# Patient Record
Sex: Male | Born: 1955 | Race: White | Hispanic: No | Smoking: Never smoker
Health system: Southern US, Community
[De-identification: ages and names within clinical notes are randomized; demographics above are authoritative.]

## PROBLEM LIST (undated history)

## (undated) DIAGNOSIS — H269 Unspecified cataract: Secondary | ICD-10-CM

## (undated) DIAGNOSIS — I10 Essential (primary) hypertension: Secondary | ICD-10-CM

## (undated) DIAGNOSIS — C801 Malignant (primary) neoplasm, unspecified: Secondary | ICD-10-CM

## (undated) DIAGNOSIS — E7800 Pure hypercholesterolemia, unspecified: Secondary | ICD-10-CM

## (undated) DIAGNOSIS — K219 Gastro-esophageal reflux disease without esophagitis: Secondary | ICD-10-CM

## (undated) HISTORY — DX: Unspecified cataract: H26.9

## (undated) HISTORY — DX: Pure hypercholesterolemia, unspecified: E78.00

---

## 2006-05-04 HISTORY — PX: HERNIA REPAIR: SHX51

## 2008-05-04 HISTORY — PX: AMPUTATION FINGER: SHX6594

## 2010-05-04 HISTORY — PX: COLONOSCOPY: SHX174

## 2017-05-05 DIAGNOSIS — R9431 Abnormal electrocardiogram [ECG] [EKG]: Secondary | ICD-10-CM

## 2020-01-09 ENCOUNTER — Other Ambulatory Visit: Payer: Self-pay | Admitting: Urology

## 2020-01-09 ENCOUNTER — Other Ambulatory Visit (HOSPITAL_COMMUNITY): Payer: Self-pay | Admitting: Urology

## 2020-01-09 DIAGNOSIS — N2889 Other specified disorders of kidney and ureter: Secondary | ICD-10-CM

## 2020-01-29 ENCOUNTER — Encounter (HOSPITAL_COMMUNITY): Payer: Self-pay

## 2020-01-29 NOTE — Patient Instructions (Addendum)
DUE TO COVID-19 ONLY ONE VISITOR IS ALLOWED TO COME WITH YOU AND STAY IN THE WAITING ROOM ONLY DURING PRE OP AND PROCEDURE DAY OF SURGERY. THE 1 VISITOR  MAY VISIT WITH YOU AFTER SURGERY IN YOUR PRIVATE ROOM DURING VISITING HOURS ONLY!  YOU NEED TO HAVE A COVID 19 TEST ON__10/1_____ @_10 :15______, THIS TEST MUST BE DONE BEFORE SURGERY,  COVID TESTING SITE [facility], IT IS ON THE RIGHT GOING OUT [address] APPROXIMATELY  2 MINUTES PAST [employer] ON THE RIGHT. ONCE YOUR COVID TEST IS COMPLETED,  PLEASE BEGIN THE QUARANTINE INSTRUCTIONS AS OUTLINED IN YOUR HANDOUT.                [facility]    Your procedure is scheduled on: 02/06/20   Report to [facility] Main  Entrance   Report to admitting at  10:30 AM     Call this number if you have problems the morning of surgery [name], NO CHEWING GUM [facility].     Take these medicines the morning of surgery with A SIP OF WATER: Pantoprazole, Flonase if needed                                 You may not have any metal on your body including              piercings  Do not wear jewelry, lotions, powders or deodorant                        Men may shave face and neck.   Do not bring valuables to the hospital. [facility].  Contacts, dentures or bridgework may not be worn into surgery.       Patients discharged the day of surgery will not be allowed to drive home.  IF YOU ARE HAVING SURGERY AND GOING HOME THE SAME DAY, YOU MUST HAVE AN ADULT TO DRIVE YOU HOME AND BE WITH YOU FOR 24 HOURS.  YOU MAY GO HOME BY TAXI OR UBER OR ORTHERWISE, BUT AN ADULT MUST ACCOMPANY YOU HOME AND STAY WITH YOU FOR 24 HOURS.  Name and phone number of your driver:  Special Instructions: N/A              Please read over the following fact sheets you were  given: _____________________________________________________________________             [facility] - Preparing for Surgery  Before surgery, you can play an important role.   Because skin is not sterile, your skin needs to be as free of germs as possible.   You can reduce the number of germs on your skin by washing with CHG (chlorahexidine gluconate) soap before surgery .  CHG is an antiseptic cleaner which kills germs and bonds with the skin to continue killing germs even after washing. Please DO NOT use if you have an allergy to CHG or antibacterial soaps.   If your skin becomes reddened/irritated stop using the CHG and inform your nurse when you arrive at Short Stay. .  You may shave your face/neck.  Please follow these instructions carefully:  1.  Shower with CHG Soap the night  before surgery and the  morning of Surgery.  2.  If you choose to wash your hair, wash your hair first as usual with your  normal  shampoo.  3.  After you shampoo, rinse your hair and body thoroughly to remove the  shampoo.                                        4.  Use CHG as you would any other liquid soap.  You can apply chg directly  to the skin and wash                       Gently with a scrungie or clean washcloth.  5.  Apply the CHG Soap to your body ONLY FROM THE NECK DOWN.   Do not use on face/ open                           Wound or open sores. Avoid contact with eyes, ears mouth and genitals (private parts).                       Wash face,  Genitals (private parts) with your normal soap.             6.  Wash thoroughly, paying special attention to the area where your surgery  will be performed.  7.  Thoroughly rinse your body with warm water from the neck down.  8.  DO NOT shower/wash with your normal soap after using and rinsing off  the CHG Soap.             9.  Pat yourself dry with a clean towel.            10.  Wear clean pajamas.            11.  Place clean sheets on your bed the night of  your first shower and do not  sleep with pets. Day of Surgery : Do not apply any lotions/deodorants the morning of surgery.  Please wear clean clothes to the hospital/surgery center.  FAILURE TO FOLLOW THESE INSTRUCTIONS MAY RESULT IN THE CANCELLATION OF YOUR SURGERY PATIENT SIGNATURE_________________________________  NURSE SIGNATURE__________________________________  ________________________________________________________________________  WHAT IS A BLOOD TRANSFUSION? Blood Transfusion Information  A transfusion is the replacement of blood or some of its parts. Blood is made up of multiple cells which provide different functions.  Red blood cells carry oxygen and are used for blood loss replacement.  White blood cells fight against infection.  Platelets control bleeding.  Plasma helps clot blood.  Other blood products are available for specialized needs, such as hemophilia or other clotting disorders. BEFORE THE TRANSFUSION  Who gives blood for transfusions?   Healthy volunteers who are fully evaluated to make sure their blood is safe. This is blood bank blood. Transfusion therapy is the safest it has ever been in the practice of medicine. Before blood is taken from a donor, a complete history is taken to make sure that person has no history of diseases nor engages in risky social behavior (examples are intravenous drug use or sexual activity with multiple partners). The donor's travel history is screened to minimize risk of transmitting infections, such as malaria. The donated blood is tested for signs of infectious diseases, such as HIV and hepatitis. The blood  is then tested to be sure it is compatible with you in order to minimize the chance of a transfusion reaction. If you or a relative donates blood, this is often done in anticipation of surgery and is not appropriate for emergency situations. It takes many days to process the donated blood. RISKS AND COMPLICATIONS Although  transfusion therapy is very safe and saves many lives, the main dangers of transfusion include:   Getting an infectious disease.  Developing a transfusion reaction. This is an allergic reaction to something in the blood you were given. Every precaution is taken to prevent this. The decision to have a blood transfusion has been considered carefully by your caregiver before blood is given. Blood is not given unless the benefits outweigh the risks. AFTER THE TRANSFUSION  Right after receiving a blood transfusion, you will usually feel much better and more energetic. This is especially true if your red blood cells have gotten low (anemic). The transfusion raises the level of the red blood cells which carry oxygen, and this usually causes an energy increase.  The nurse administering the transfusion will monitor you carefully for complications. HOME CARE INSTRUCTIONS  No special instructions are needed after a transfusion. You may find your energy is better. Speak with your caregiver about any limitations on activity for underlying diseases you may have. SEEK MEDICAL CARE IF:   Your condition is not improving after your transfusion.  You develop redness or irritation at the intravenous (IV) site. SEEK IMMEDIATE MEDICAL CARE IF:  Any of the following symptoms occur over the next 12 hours:  Shaking chills.  You have a temperature by mouth above 102 F (38.9 C), not controlled by medicine.  Chest, back, or muscle pain.  People around you feel you are not acting correctly or are confused.  Shortness of breath or difficulty breathing.  Dizziness and fainting.  You get a rash or develop hives.  You have a decrease in urine output.  Your urine turns a dark color or changes to pink, red, or brown. Any of the following symptoms occur over the next 10 days:  You have a temperature by mouth above 102 F (38.9 C), not controlled by medicine.  Shortness of breath.  Weakness after normal  activity.  The white part of the eye turns yellow (jaundice).  You have a decrease in the amount of urine or are urinating less often.  Your urine turns a dark color or changes to pink, red, or brown. Document Released: 04/17/2000 Document Revised: 07/13/2011 Document Reviewed: 12/05/2007 [facility] Patient Information 2014 [place], [place].  _______________________________________________________________________

## 2020-01-30 ENCOUNTER — Encounter (HOSPITAL_COMMUNITY): Payer: Self-pay

## 2020-01-30 ENCOUNTER — Encounter (HOSPITAL_COMMUNITY)
Admission: RE | Admit: 2020-01-30 | Discharge: 2020-01-30 | Disposition: A | Discharge status: Home / Self Care | Payer: PRIVATE HEALTH INSURANCE | Source: Ambulatory Visit | Attending: Urology | Admitting: Urology

## 2020-01-30 ENCOUNTER — Other Ambulatory Visit: Payer: Self-pay

## 2020-01-30 DIAGNOSIS — I10 Essential (primary) hypertension: Secondary | ICD-10-CM | POA: Diagnosis not present

## 2020-01-30 DIAGNOSIS — Z01818 Encounter for other preprocedural examination: Secondary | ICD-10-CM | POA: Diagnosis present

## 2020-01-30 DIAGNOSIS — Z20822 Contact with and (suspected) exposure to covid-19: Secondary | ICD-10-CM | POA: Insufficient documentation

## 2020-01-30 HISTORY — DX: Essential (primary) hypertension: I10

## 2020-01-30 HISTORY — DX: Malignant (primary) neoplasm, unspecified: C80.1

## 2020-01-30 HISTORY — DX: Gastro-esophageal reflux disease without esophagitis: K21.9

## 2020-01-30 LAB — CBC
HCT: 40.2 % (ref 39.0–52.0)
Hemoglobin: 13.5 g/dL (ref 13.0–17.0)
MCH: 31.8 pg (ref 26.0–34.0)
MCHC: 33.6 g/dL (ref 30.0–36.0)
MCV: 94.8 fL (ref 80.0–100.0)
Platelets: 228 10*3/uL (ref 150–400)
RBC: 4.24 MIL/uL (ref 4.22–5.81)
RDW: 12.6 % (ref 11.5–15.5)
WBC: 9 10*3/uL (ref 4.0–10.5)
nRBC: 0 % (ref 0.0–0.2)

## 2020-01-30 LAB — BASIC METABOLIC PANEL
Anion gap: 7 (ref 5–15)
BUN: 13 mg/dL (ref 8–23)
CO2: 25 mmol/L (ref 22–32)
Calcium: 9.1 mg/dL (ref 8.9–10.3)
Chloride: 106 mmol/L (ref 98–111)
Creatinine, Ser: 1.07 mg/dL (ref 0.61–1.24)
GFR calc Af Amer: >60 mL/min (ref >60)
GFR calc non Af Amer: >60 mL/min (ref >60)
Glucose, Bld: 89 mg/dL (ref 70–99)
Potassium: 4.6 mmol/L (ref 3.5–5.1)
Sodium: 138 mmol/L (ref 135–145)

## 2020-01-30 NOTE — Progress Notes (Signed)
COVID Vaccine Completed:Yes Date COVID Vaccine completed:06/21/19 COVID vaccine manufacturer:  Moderna     PCP - Carlyle Lipa Cardiologist - no  Chest x-ray - no EKG - Requested from [facility] family practice in [place] Stress Test - no ECHO - no Cardiac Cath - no Pacemaker/ICD device last checked:NA  Sleep Study - no CPAP -   Fasting Blood Sugar - NA Checks Blood Sugar _____ times a day  Blood Thinner Instructions:ASA/ Dr. Nicki Reaper Aspirin Instructions:Stop 5 days before DOS/ Dr. Abner Greenspan Last Dose:02/01/20  Anesthesia review:   Patient denies shortness of breath, fever, cough and chest pain at PAT appointment yes   Patient verbalized understanding of instructions that were given to them at the PAT appointment. Patient was also instructed that they will need to review over the PAT instructions again at home before surgery. Yes Pt works out every day he has no SOB climbing stairs, working or with ADLs

## 2020-02-02 ENCOUNTER — Other Ambulatory Visit (HOSPITAL_COMMUNITY)
Admission: RE | Admit: 2020-02-02 | Discharge: 2020-02-02 | Disposition: A | Discharge status: Home / Self Care | Payer: PRIVATE HEALTH INSURANCE | Source: Ambulatory Visit | Attending: Urology | Admitting: Urology

## 2020-02-02 DIAGNOSIS — Z20822 Contact with and (suspected) exposure to covid-19: Secondary | ICD-10-CM | POA: Diagnosis present

## 2020-02-02 DIAGNOSIS — Z01812 Encounter for preprocedural laboratory examination: Secondary | ICD-10-CM | POA: Insufficient documentation

## 2020-02-02 LAB — SARS CORONAVIRUS 2 (TAT 6-24 HRS): SARS Coronavirus 2: NEGATIVE

## 2020-02-06 ENCOUNTER — Ambulatory Visit (HOSPITAL_COMMUNITY): Payer: PRIVATE HEALTH INSURANCE | Admitting: Anesthesiology

## 2020-02-06 ENCOUNTER — Other Ambulatory Visit: Payer: Self-pay

## 2020-02-06 ENCOUNTER — Ambulatory Visit (HOSPITAL_COMMUNITY)
Admission: RE | Admit: 2020-02-06 | Discharge: 2020-02-06 | Disposition: A | Discharge status: Home / Self Care | Payer: PRIVATE HEALTH INSURANCE | Source: Ambulatory Visit | Attending: Urology | Admitting: Urology

## 2020-02-06 ENCOUNTER — Inpatient Hospital Stay (HOSPITAL_COMMUNITY)
Admission: AD | Admit: 2020-02-06 | Discharge: 2020-02-10 | DRG: 658 | Disposition: A | Discharge status: Home / Self Care | Payer: PRIVATE HEALTH INSURANCE | Attending: Urology | Admitting: Urology

## 2020-02-06 ENCOUNTER — Encounter (HOSPITAL_COMMUNITY): Payer: Self-pay | Admitting: Urology

## 2020-02-06 ENCOUNTER — Encounter (HOSPITAL_COMMUNITY)
Admission: AD | Disposition: A | Discharge status: Home / Self Care | Payer: Self-pay | Source: Home / Self Care | Attending: Urology

## 2020-02-06 DIAGNOSIS — Z20822 Contact with and (suspected) exposure to covid-19: Secondary | ICD-10-CM | POA: Diagnosis present

## 2020-02-06 DIAGNOSIS — N2889 Other specified disorders of kidney and ureter: Secondary | ICD-10-CM | POA: Diagnosis present

## 2020-02-06 DIAGNOSIS — C641 Malignant neoplasm of right kidney, except renal pelvis: Secondary | ICD-10-CM | POA: Diagnosis present

## 2020-02-06 DIAGNOSIS — Z7982 Long term (current) use of aspirin: Secondary | ICD-10-CM

## 2020-02-06 DIAGNOSIS — I10 Essential (primary) hypertension: Secondary | ICD-10-CM | POA: Diagnosis present

## 2020-02-06 DIAGNOSIS — K219 Gastro-esophageal reflux disease without esophagitis: Secondary | ICD-10-CM | POA: Diagnosis present

## 2020-02-06 DIAGNOSIS — Z79899 Other long term (current) drug therapy: Secondary | ICD-10-CM

## 2020-02-06 HISTORY — PX: OPERATIVE ULTRASOUND: SHX5996

## 2020-02-06 HISTORY — PX: ROBOTIC ASSITED PARTIAL NEPHRECTOMY: SHX6087

## 2020-02-06 LAB — CBC
HCT: 28.8 % — ABNORMAL LOW (ref 39.0–52.0)
Hemoglobin: 9.5 g/dL — ABNORMAL LOW (ref 13.0–17.0)
MCH: 32 pg (ref 26.0–34.0)
MCHC: 33 g/dL (ref 30.0–36.0)
MCV: 97 fL (ref 80.0–100.0)
Platelets: 223 10*3/uL (ref 150–400)
RBC: 2.97 MIL/uL — ABNORMAL LOW (ref 4.22–5.81)
RDW: 12.9 % (ref 11.5–15.5)
WBC: 24.6 10*3/uL — ABNORMAL HIGH (ref 4.0–10.5)
nRBC: 0 % (ref 0.0–0.2)

## 2020-02-06 LAB — CREATININE, SERUM
Creatinine, Ser: 1.41 mg/dL — ABNORMAL HIGH (ref 0.61–1.24)
GFR calc non Af Amer: 52 mL/min — ABNORMAL LOW (ref >60)

## 2020-02-06 LAB — TYPE AND SCREEN
ABO/RH(D): A POS
Antibody Screen: NEGATIVE

## 2020-02-06 LAB — HIV ANTIBODY (ROUTINE TESTING W REFLEX): HIV Screen 4th Generation wRfx: NONREACTIVE

## 2020-02-06 LAB — ABO/RH: ABO/RH(D): A POS

## 2020-02-06 SURGERY — NEPHRECTOMY, PARTIAL, ROBOT-ASSISTED
Anesthesia: General | Site: Abdomen | Laterality: Right

## 2020-02-06 MED ORDER — PROPOFOL 10 MG/ML IV BOLUS
INTRAVENOUS | Status: AC
  Filled 2020-02-06: qty 20

## 2020-02-06 MED ORDER — LIDOCAINE 2% (20 MG/ML) 5 ML SYRINGE
INTRAMUSCULAR | Status: AC
  Filled 2020-02-06: qty 5

## 2020-02-06 MED ORDER — EPHEDRINE 5 MG/ML INJ
INTRAVENOUS | Status: AC
  Filled 2020-02-06: qty 10

## 2020-02-06 MED ORDER — OXYCODONE-ACETAMINOPHEN 5-325 MG PO TABS
1.0000 | ORAL_TABLET | ORAL | Status: DC | PRN
  Administered 2020-02-07 (×2): 1 via ORAL
  Administered 2020-02-07 – 2020-02-09 (×8): 2 via ORAL
  Administered 2020-02-09 – 2020-02-10 (×4): 1 via ORAL
  Filled 2020-02-06 (×5): qty 2
  Filled 2020-02-06: qty 1
  Filled 2020-02-06: qty 2
  Filled 2020-02-06 (×2): qty 1
  Filled 2020-02-06 (×4): qty 2
  Filled 2020-02-06: qty 1

## 2020-02-06 MED ORDER — BACITRACIN-NEOMYCIN-POLYMYXIN 400-5-5000 EX OINT
1.0000 "application " | TOPICAL_OINTMENT | Freq: Three times a day (TID) | CUTANEOUS | Status: DC | PRN
  Filled 2020-02-06: qty 1

## 2020-02-06 MED ORDER — BELLADONNA ALKALOIDS-OPIUM 16.2-60 MG RE SUPP: 1.0000 | Freq: Four times a day (QID) | RECTAL | Status: DC | PRN

## 2020-02-06 MED ORDER — MIDAZOLAM HCL 2 MG/2ML IJ SOLN
INTRAMUSCULAR | Status: AC
  Filled 2020-02-06: qty 2

## 2020-02-06 MED ORDER — STERILE WATER FOR IRRIGATION IR SOLN
Status: DC | PRN
  Administered 2020-02-06: 1000 mL

## 2020-02-06 MED ORDER — PHENYLEPHRINE HCL (PRESSORS) 10 MG/ML IV SOLN
INTRAVENOUS | Status: AC
  Filled 2020-02-06: qty 1

## 2020-02-06 MED ORDER — OXYBUTYNIN CHLORIDE 5 MG PO TABS: 5.0000 mg | ORAL_TABLET | Freq: Three times a day (TID) | ORAL | Status: DC | PRN

## 2020-02-06 MED ORDER — PANTOPRAZOLE SODIUM 40 MG PO TBEC
40.0000 mg | DELAYED_RELEASE_TABLET | Freq: Every day | ORAL | Status: DC
  Administered 2020-02-07 – 2020-02-10 (×4): 40 mg via ORAL
  Filled 2020-02-06 (×5): qty 1

## 2020-02-06 MED ORDER — FENTANYL CITRATE (PF) 100 MCG/2ML IJ SOLN
INTRAMUSCULAR | Status: AC
  Filled 2020-02-06: qty 2

## 2020-02-06 MED ORDER — HEPARIN SODIUM (PORCINE) 5000 UNIT/ML IJ SOLN
5000.0000 [IU] | Freq: Three times a day (TID) | INTRAMUSCULAR | Status: DC
  Administered 2020-02-07 – 2020-02-10 (×9): 5000 [IU] via SUBCUTANEOUS
  Filled 2020-02-06 (×9): qty 1

## 2020-02-06 MED ORDER — ACETAMINOPHEN 325 MG PO TABS: 650.0000 mg | ORAL_TABLET | ORAL | Status: DC | PRN

## 2020-02-06 MED ORDER — POLYETHYLENE GLYCOL 3350 17 G PO PACK
17.0000 g | PACK | Freq: Every day | ORAL | Status: DC | PRN
  Administered 2020-02-09: 17 g via ORAL
  Filled 2020-02-06 (×2): qty 1

## 2020-02-06 MED ORDER — LIDOCAINE 2% (20 MG/ML) 5 ML SYRINGE
INTRAMUSCULAR | Status: DC | PRN
  Administered 2020-02-06: 60 mg via INTRAVENOUS

## 2020-02-06 MED ORDER — IRBESARTAN 150 MG PO TABS
150.0000 mg | ORAL_TABLET | Freq: Every day | ORAL | Status: DC
  Administered 2020-02-06 – 2020-02-10 (×5): 150 mg via ORAL
  Filled 2020-02-06 (×5): qty 1

## 2020-02-06 MED ORDER — PHENYLEPHRINE HCL-NACL 10-0.9 MG/250ML-% IV SOLN
INTRAVENOUS | Status: DC | PRN
  Administered 2020-02-06: 25 ug/min via INTRAVENOUS

## 2020-02-06 MED ORDER — CEFAZOLIN SODIUM-DEXTROSE 2-4 GM/100ML-% IV SOLN
INTRAVENOUS | Status: AC
  Filled 2020-02-06: qty 100

## 2020-02-06 MED ORDER — OXYCODONE HCL 5 MG PO TABS
ORAL_TABLET | ORAL | Status: AC
  Filled 2020-02-06: qty 1

## 2020-02-06 MED ORDER — ALBUMIN HUMAN 5 % IV SOLN
INTRAVENOUS | Status: AC
  Filled 2020-02-06: qty 250

## 2020-02-06 MED ORDER — BUPIVACAINE LIPOSOME 1.3 % IJ SUSP
20.0000 mL | Freq: Once | INTRAMUSCULAR | Status: AC
  Administered 2020-02-06: 40 mL
  Filled 2020-02-06: qty 20

## 2020-02-06 MED ORDER — ACETAMINOPHEN 10 MG/ML IV SOLN: 1000.0000 mg | Freq: Once | INTRAVENOUS | Status: DC | PRN

## 2020-02-06 MED ORDER — BISACODYL 10 MG RE SUPP
10.0000 mg | Freq: Every day | RECTAL | Status: DC | PRN
  Administered 2020-02-09: 10 mg via RECTAL
  Filled 2020-02-06 (×2): qty 1

## 2020-02-06 MED ORDER — DOCUSATE SODIUM 100 MG PO CAPS: 100.0000 mg | ORAL_CAPSULE | Freq: Every day | ORAL | 0 refills | Status: DC | PRN

## 2020-02-06 MED ORDER — FAMOTIDINE 20 MG PO TABS
40.0000 mg | ORAL_TABLET | Freq: Two times a day (BID) | ORAL | Status: DC
  Administered 2020-02-06 – 2020-02-07 (×2): 40 mg via ORAL
  Filled 2020-02-06 (×2): qty 2

## 2020-02-06 MED ORDER — HYDROMORPHONE HCL 1 MG/ML IJ SOLN
0.2500 mg | INTRAMUSCULAR | Status: DC | PRN
  Administered 2020-02-06 (×2): 0.5 mg via INTRAVENOUS

## 2020-02-06 MED ORDER — OXYCODONE HCL 5 MG/5ML PO SOLN: 5.0000 mg | Freq: Once | ORAL | Status: DC | PRN

## 2020-02-06 MED ORDER — MORPHINE SULFATE (PF) 4 MG/ML IV SOLN: 2.0000 mg | INTRAVENOUS | Status: DC | PRN

## 2020-02-06 MED ORDER — ADULT MULTIVITAMIN W/MINERALS CH
1.0000 | ORAL_TABLET | Freq: Every day | ORAL | Status: DC
  Administered 2020-02-06 – 2020-02-10 (×5): 1 via ORAL
  Filled 2020-02-06 (×5): qty 1

## 2020-02-06 MED ORDER — OXYCODONE-ACETAMINOPHEN 5-325 MG PO TABS: 1.0000 | ORAL_TABLET | ORAL | 0 refills | Status: DC | PRN

## 2020-02-06 MED ORDER — FLUTICASONE PROPIONATE 50 MCG/ACT NA SUSP: 1.0000 | Freq: Every day | NASAL | Status: DC | PRN

## 2020-02-06 MED ORDER — FENTANYL CITRATE (PF) 250 MCG/5ML IJ SOLN
INTRAMUSCULAR | Status: DC | PRN
Start: 2020-02-06 — End: 2020-02-06
  Administered 2020-02-06 (×3): 50 ug via INTRAVENOUS
  Administered 2020-02-06: 100 ug via INTRAVENOUS
  Administered 2020-02-06 (×4): 50 ug via INTRAVENOUS
  Administered 2020-02-06: 100 ug via INTRAVENOUS

## 2020-02-06 MED ORDER — LACTATED RINGERS IV SOLN: INTRAVENOUS | Status: DC

## 2020-02-06 MED ORDER — ALBUMIN HUMAN 5 % IV SOLN: INTRAVENOUS | Status: DC | PRN

## 2020-02-06 MED ORDER — MIDAZOLAM HCL 5 MG/5ML IJ SOLN
INTRAMUSCULAR | Status: DC | PRN
  Administered 2020-02-06: 2 mg via INTRAVENOUS

## 2020-02-06 MED ORDER — HEPARIN SODIUM (PORCINE) 5000 UNIT/ML IJ SOLN
INTRAMUSCULAR | Status: AC
  Administered 2020-02-06: 5000 [IU] via SUBCUTANEOUS
  Filled 2020-02-06: qty 1

## 2020-02-06 MED ORDER — PROPOFOL 10 MG/ML IV BOLUS
INTRAVENOUS | Status: DC | PRN
  Administered 2020-02-06: 150 mg via INTRAVENOUS

## 2020-02-06 MED ORDER — "VISTASEAL 4 ML SINGLE DOSE KIT "
4.0000 mL | PACK | Freq: Once | CUTANEOUS | Status: DC
  Filled 2020-02-06: qty 4

## 2020-02-06 MED ORDER — DIPHENHYDRAMINE HCL 12.5 MG/5ML PO ELIX
12.5000 mg | ORAL_SOLUTION | Freq: Four times a day (QID) | ORAL | Status: DC | PRN
  Filled 2020-02-06: qty 5

## 2020-02-06 MED ORDER — DIPHENHYDRAMINE HCL 50 MG/ML IJ SOLN: 12.5000 mg | Freq: Four times a day (QID) | INTRAMUSCULAR | Status: DC | PRN

## 2020-02-06 MED ORDER — DEXAMETHASONE SODIUM PHOSPHATE 10 MG/ML IJ SOLN
INTRAMUSCULAR | Status: AC
  Filled 2020-02-06: qty 1

## 2020-02-06 MED ORDER — ONDANSETRON HCL 4 MG/2ML IJ SOLN
INTRAMUSCULAR | Status: DC | PRN
  Administered 2020-02-06: 4 mg via INTRAVENOUS

## 2020-02-06 MED ORDER — ONDANSETRON HCL 4 MG/2ML IJ SOLN
INTRAMUSCULAR | Status: AC
  Filled 2020-02-06: qty 2

## 2020-02-06 MED ORDER — PHENYLEPHRINE 40 MCG/ML (10ML) SYRINGE FOR IV PUSH (FOR BLOOD PRESSURE SUPPORT)
PREFILLED_SYRINGE | INTRAVENOUS | Status: DC | PRN
  Administered 2020-02-06 (×3): 120 ug via INTRAVENOUS
  Administered 2020-02-06: 80 ug via INTRAVENOUS

## 2020-02-06 MED ORDER — LACTATED RINGERS IR SOLN
Status: DC | PRN
  Administered 2020-02-06: 1000 mL

## 2020-02-06 MED ORDER — SUGAMMADEX SODIUM 200 MG/2ML IV SOLN
INTRAVENOUS | Status: DC | PRN
  Administered 2020-02-06: 200 mg via INTRAVENOUS

## 2020-02-06 MED ORDER — SODIUM CHLORIDE (PF) 0.9 % IJ SOLN
INTRAMUSCULAR | Status: AC
  Filled 2020-02-06: qty 20

## 2020-02-06 MED ORDER — OXYCODONE-ACETAMINOPHEN 5-325 MG PO TABS
ORAL_TABLET | ORAL | Status: AC
  Administered 2020-02-06: 1 via ORAL
  Filled 2020-02-06: qty 1

## 2020-02-06 MED ORDER — PRAVASTATIN SODIUM 20 MG PO TABS
20.0000 mg | ORAL_TABLET | Freq: Every day | ORAL | Status: DC
  Administered 2020-02-06 – 2020-02-09 (×4): 20 mg via ORAL
  Filled 2020-02-06 (×4): qty 1

## 2020-02-06 MED ORDER — OXYCODONE HCL 5 MG PO TABS: 5.0000 mg | ORAL_TABLET | Freq: Once | ORAL | Status: DC | PRN

## 2020-02-06 MED ORDER — ORAL CARE MOUTH RINSE: 15.0000 mL | Freq: Once | OROMUCOSAL | Status: AC

## 2020-02-06 MED ORDER — ONDANSETRON HCL 4 MG/2ML IJ SOLN
4.0000 mg | INTRAMUSCULAR | Status: DC | PRN
  Administered 2020-02-09: 4 mg via INTRAVENOUS
  Filled 2020-02-06: qty 2

## 2020-02-06 MED ORDER — SODIUM CHLORIDE 0.9 % IV SOLN: INTRAVENOUS | Status: DC

## 2020-02-06 MED ORDER — CHLORHEXIDINE GLUCONATE 0.12 % MT SOLN
15.0000 mL | Freq: Once | OROMUCOSAL | Status: AC
  Administered 2020-02-06: 15 mL via OROMUCOSAL

## 2020-02-06 MED ORDER — DEXAMETHASONE SODIUM PHOSPHATE 10 MG/ML IJ SOLN
INTRAMUSCULAR | Status: DC | PRN
  Administered 2020-02-06: 10 mg via INTRAVENOUS

## 2020-02-06 MED ORDER — PHENYLEPHRINE 40 MCG/ML (10ML) SYRINGE FOR IV PUSH (FOR BLOOD PRESSURE SUPPORT)
PREFILLED_SYRINGE | INTRAVENOUS | Status: AC
  Filled 2020-02-06: qty 10

## 2020-02-06 MED ORDER — ROCURONIUM BROMIDE 10 MG/ML (PF) SYRINGE
PREFILLED_SYRINGE | INTRAVENOUS | Status: AC
  Filled 2020-02-06: qty 10

## 2020-02-06 MED ORDER — DOCUSATE SODIUM 100 MG PO CAPS
100.0000 mg | ORAL_CAPSULE | Freq: Two times a day (BID) | ORAL | Status: DC
  Administered 2020-02-06 – 2020-02-09 (×7): 100 mg via ORAL
  Filled 2020-02-06 (×7): qty 1

## 2020-02-06 MED ORDER — HYDROMORPHONE HCL 1 MG/ML IJ SOLN
INTRAMUSCULAR | Status: AC
Start: 2020-02-06 — End: 2020-02-07
  Filled 2020-02-06: qty 1

## 2020-02-06 MED ORDER — FENTANYL CITRATE (PF) 250 MCG/5ML IJ SOLN
INTRAMUSCULAR | Status: AC
  Filled 2020-02-06: qty 5

## 2020-02-06 MED ORDER — ROCURONIUM BROMIDE 10 MG/ML (PF) SYRINGE
PREFILLED_SYRINGE | INTRAVENOUS | Status: DC | PRN
  Administered 2020-02-06: 20 mg via INTRAVENOUS
  Administered 2020-02-06: 10 mg via INTRAVENOUS
  Administered 2020-02-06: 70 mg via INTRAVENOUS
  Administered 2020-02-06: 10 mg via INTRAVENOUS
  Administered 2020-02-06: 30 mg via INTRAVENOUS
  Administered 2020-02-06: 20 mg via INTRAVENOUS

## 2020-02-06 MED ORDER — EPHEDRINE SULFATE-NACL 50-0.9 MG/10ML-% IV SOSY
PREFILLED_SYRINGE | INTRAVENOUS | Status: DC | PRN
  Administered 2020-02-06 (×2): 10 mg via INTRAVENOUS

## 2020-02-06 MED ORDER — CEFAZOLIN SODIUM-DEXTROSE 2-4 GM/100ML-% IV SOLN
2.0000 g | Freq: Once | INTRAVENOUS | Status: AC
  Administered 2020-02-06 (×2): 2 g via INTRAVENOUS

## 2020-02-06 SURGICAL SUPPLY — 76 items
APPLICATOR VISTASEAL 35 (MISCELLANEOUS) ×3 IMPLANT
CHLORAPREP W/TINT 26 (MISCELLANEOUS) ×3 IMPLANT
CLIP SUT LAPRA TY ABSORB (SUTURE) ×24 IMPLANT
CLIP VESOLOCK LG 6/CT PURPLE (CLIP) ×3 IMPLANT
CLIP VESOLOCK MED LG 6/CT (CLIP) ×15 IMPLANT
CLIP VESOLOCK XL 6/CT (CLIP) ×3 IMPLANT
COVER SURGICAL LIGHT HANDLE (MISCELLANEOUS) ×3 IMPLANT
COVER TIP SHEARS 8 DVNC (MISCELLANEOUS) ×1 IMPLANT
COVER TIP SHEARS 8MM DA VINCI (MISCELLANEOUS) ×2
COVER WAND RF STERILE (DRAPES) IMPLANT
CUTTER ECHEON FLEX ENDO 45 340 (ENDOMECHANICALS) IMPLANT
DECANTER SPIKE VIAL GLASS SM (MISCELLANEOUS) ×3 IMPLANT
DERMABOND ADVANCED (GAUZE/BANDAGES/DRESSINGS) ×2
DERMABOND ADVANCED .7 DNX12 (GAUZE/BANDAGES/DRESSINGS) ×1 IMPLANT
DRAIN CHANNEL 15F RND FF 3/16 (WOUND CARE) ×3 IMPLANT
DRAPE ARM DVNC X/XI (DISPOSABLE) ×4 IMPLANT
DRAPE COLUMN DVNC XI (DISPOSABLE) ×1 IMPLANT
DRAPE DA VINCI XI ARM (DISPOSABLE) ×8
DRAPE DA VINCI XI COLUMN (DISPOSABLE) ×2
DRAPE INCISE IOBAN 66X45 STRL (DRAPES) ×3 IMPLANT
DRAPE SHEET LG 3/4 BI-LAMINATE (DRAPES) ×3 IMPLANT
ELECT PENCIL ROCKER SW 15FT (MISCELLANEOUS) ×3 IMPLANT
ELECT REM PT RETURN 15FT ADLT (MISCELLANEOUS) ×3 IMPLANT
EVACUATOR SILICONE 100CC (DRAIN) ×3 IMPLANT
GLOVE BIO SURGEON STRL SZ 6.5 (GLOVE) ×2 IMPLANT
GLOVE BIO SURGEONS STRL SZ 6.5 (GLOVE) ×1
GLOVE BIOGEL M STRL SZ7.5 (GLOVE) ×6 IMPLANT
GLOVE BIOGEL PI IND STRL 8 (GLOVE) ×2 IMPLANT
GLOVE BIOGEL PI INDICATOR 8 (GLOVE) ×4
GOWN STRL REUS W/TWL LRG LVL3 (GOWN DISPOSABLE) ×3 IMPLANT
GOWN STRL REUS W/TWL XL LVL3 (GOWN DISPOSABLE) ×6 IMPLANT
HEMOSTAT SURGICEL 4X8 (HEMOSTASIS) ×3 IMPLANT
IRRIG SUCT STRYKERFLOW 2 WTIP (MISCELLANEOUS) ×3
IRRIGATION SUCT STRKRFLW 2 WTP (MISCELLANEOUS) ×1 IMPLANT
KIT BASIN OR (CUSTOM PROCEDURE TRAY) ×3 IMPLANT
KIT TURNOVER KIT A (KITS) IMPLANT
LOOP VESSEL MAXI BLUE (MISCELLANEOUS) ×3 IMPLANT
MARKER SKIN DUAL TIP RULER LAB (MISCELLANEOUS) ×3 IMPLANT
NEEDLE INSUFFLATION 14GA 120MM (NEEDLE) ×3 IMPLANT
PENCIL SMOKE EVACUATOR (MISCELLANEOUS) IMPLANT
POUCH SPECIMEN RETRIEVAL 10MM (ENDOMECHANICALS) ×6 IMPLANT
PROTECTOR NERVE ULNAR (MISCELLANEOUS) ×6 IMPLANT
SEAL CANN UNIV 5-8 DVNC XI (MISCELLANEOUS) ×4 IMPLANT
SEAL XI 5MM-8MM UNIVERSAL (MISCELLANEOUS) ×8
SEALANT SURGICAL APPL DUAL CAN (MISCELLANEOUS) IMPLANT
SET TUBE SMOKE EVAC HIGH FLOW (TUBING) ×3 IMPLANT
SOLUTION ELECTROLUBE (MISCELLANEOUS) ×3 IMPLANT
STAPLE RELOAD 45 WHT (STAPLE) ×2 IMPLANT
STAPLE RELOAD 45MM WHITE (STAPLE) ×4
SUT ETHILON 2 0 PSLX (SUTURE) IMPLANT
SUT MNCRL AB 4-0 PS2 18 (SUTURE) ×6 IMPLANT
SUT PDS AB 0 CT1 36 (SUTURE) ×3 IMPLANT
SUT PROLENE 4 0 RB 1 (SUTURE) ×2
SUT PROLENE 4-0 RB1 .5 CRCL 36 (SUTURE) ×1 IMPLANT
SUT V-LOC BARB 180 2/0GR6 GS22 (SUTURE)
SUT VIC AB 1 CT1 27 (SUTURE)
SUT VIC AB 1 CT1 27XBRD ANTBC (SUTURE) IMPLANT
SUT VIC AB 1 CTX 36 (SUTURE) ×6
SUT VIC AB 1 CTX36XBRD ANBCTR (SUTURE) ×3 IMPLANT
SUT VIC AB 2-0 SH 27 (SUTURE) ×18
SUT VIC AB 2-0 SH 27XBRD (SUTURE) ×9 IMPLANT
SUT VIC AB 3-0 SH 27 (SUTURE) ×4
SUT VIC AB 3-0 SH 27XBRD (SUTURE) ×2 IMPLANT
SUT VICRYL 0 UR6 27IN ABS (SUTURE) ×3 IMPLANT
SUT VLOC 180 3-0 18IN (SUTURE) ×9 IMPLANT
SUT VLOC BARB 180 ABS3/0GR12 (SUTURE) ×3
SUTURE V-LC BRB 180 2/0GR6GS22 (SUTURE) IMPLANT
SUTURE VLOC BRB 180 ABS3/0GR12 (SUTURE) ×1 IMPLANT
TOWEL OR 17X26 10 PK STRL BLUE (TOWEL DISPOSABLE) ×3 IMPLANT
TOWEL OR NON WOVEN STRL DISP B (DISPOSABLE) ×3 IMPLANT
TRAY FOLEY MTR SLVR 16FR STAT (SET/KITS/TRAYS/PACK) ×3 IMPLANT
TRAY LAPAROSCOPIC (CUSTOM PROCEDURE TRAY) ×3 IMPLANT
TROCAR BLADELESS OPT 5 100 (ENDOMECHANICALS) ×3 IMPLANT
TROCAR UNIVERSAL OPT 12M 100M (ENDOMECHANICALS) IMPLANT
TROCAR XCEL 12X100 BLDLESS (ENDOMECHANICALS) ×3 IMPLANT
WATER STERILE IRR 1000ML POUR (IV SOLUTION) ×3 IMPLANT

## 2020-02-06 NOTE — Brief Op Note (Signed)
02/06/2020  6:11 PM  PATIENT:  [facility]  64 y.o. male  PRE-OPERATIVE DIAGNOSIS:  RIGHT RENAL MASS  POST-OPERATIVE DIAGNOSIS:  RIGHT RENAL MASS  PROCEDURE:  Procedure(s): XI ROBOTIC ASSITED LAPAROSCOPIC   NEPHRECTOMY (Right) INTRA-OPERATIVE ULTRASOUND (Right)  (Right robotic assisted laparoscopic right partial nephrectomy converted to right robotic assisted laparoscopic radical nephrectomy (adrenal sparing))  SURGEON:  Surgeon(s) and Role:    * [name], MD - Primary    * Lovena Neighbours, Conception Oms, MD  ANESTHESIA:   general  EBL:  850 mL   BLOOD ADMINISTERED:none  DRAINS: none   LOCAL MEDICATIONS USED:  EXPAREL  SPECIMEN:   ID Type Source Tests Collected by Time Destination  1 : RIGHT RENAL MASS Tissue PATH GU resection / TURBT / partial nephrectomy SURGICAL PATHOLOGY [name], MD 02/06/2020 1740   2 : RIGHT KIDNEY Tissue PATH GU resection / TURBT / partial nephrectomy SURGICAL PATHOLOGY [name], MD 02/06/2020 1742    DISPOSITION OF SPECIMEN:  PATHOLOGY  COUNTS:  YES  TOURNIQUET:  * No tourniquets in log *  DICTATION: .Note written in EPIC  PLAN OF CARE: Admit to inpatient   PATIENT DISPOSITION:  Short Stay   Delay start of Pharmacological VTE agent (>24hrs) due to surgical blood loss or risk of bleeding: no

## 2020-02-06 NOTE — Discharge Instructions (Signed)

## 2020-02-06 NOTE — Op Note (Addendum)
Operative Note  Preoperative diagnosis:  1.  Right renal mass  Postoperative diagnosis: 1.  Right renal mass  Procedure(s): 1.  Robotic assisted laparoscopic right radical nephrectomy (adrenal sparing)  Surgeon: Rexene Alberts, MD  Assistant: Dr. Aleen Campi  Anesthesia:  General  Complications:  None  EBL:  8106ml  Specimens: 1.  ID Type Source Tests Collected by Time Destination  1 : RIGHT RENAL MASS Tissue PATH GU resection / TURBT / partial nephrectomy SURGICAL PATHOLOGY [name], MD 02/06/2020 1740   2 : RIGHT KIDNEY Tissue PATH GU resection / TURBT / partial nephrectomy SURGICAL PATHOLOGY [name], MD 02/06/2020 1742    Drains/Catheters: 1.  none  Intraoperative findings:   1. 3 arteries and one vein. Right posterior interpolar mass approximately 4cm as endophytic as the exophytic component. Successful excision of mass with wide margin along with collecting system entrance; however, large defect that despite running 3-0 v-locks across the base and the sliding weck clip rennorphahy technique, there was persistent bleeding requiring conversion to right robotic assisted laparoscopic radical nephrectomy. Excellent hemostasis at the end of the case.  Indication:  [facility] is a 63 y.o. male with a right renal mass identified on CT renal protocol measuring 3.5 x 3.3 x 3.3 in the interpolar region of the right kidney.  Nephrometry score 7P.  After thorough discussion including all relevant risk benefits and alternatives including  infection, bleeding (Intra-op or postop with fistula or pseudoaneurysm), transfusion, urine leak, higher complication rate with partial nephrectomy, possible conversion to open partial and/or total nephrectomy with subsequent diminished preservation of renal function, possible injury to surrounding organs, possible benign path or positive margins, and global anesthesia risk, he desires to proceed.  Description of procedure: Informed consent was  obtained.  The patient was marked on the right side and then taken to the operating room, placed supine on the operating table.  General anesthesia was provided. SCDs were provided for DVT prophylaxis and IV antibiotics for bacterial prophylaxis.  Foley catheter was placed to drain the bladder.  OG tube was placed by anesthesia.  The patient was then positioned in left lateral decubitus position with the right flank elevated about 70 degrees.  All pressure points were carefully padded.  The table was flexed slightly.  The right arm was placed in a padded support.  The patient was secured to the table with soft chest, hip, and lower extremity straps.  The patient was prepped and draped in the usual sterile fashion.  We had a time-out confirming the patient identification, the planned procedure, the surgical site, and all present were in agreement.  A Veress needle was placed just lateral to the right rectus belly at the level of the umbilicus. Aspiration, irrigation, and saline drop test confirmed good position.  Opening pressure was less than 9 mmHg. The abdomen was insufflated to 15 mmHg.  Following this, trocar sites were mapped out with two robotic trocars triangulating the expected location of the tumor, a 8 mm trocar between, for the camera, 12 mm midline trocar inferior to the umbilicus for the assistant, and an 85mm trocar in the right lower quadrant for the 4th arm.  All trocar sites were infiltrated with liposomal marcaine.   I used a visual obturator to place a 5 mm trocar at one of the robotic port sites, and the peritoneal cavity was surveyed with no abnormalities or injuries.  Remaining trocars were placed under laparoscopic visualization. The robot was docked.  I placed monopolar  shears in the right arm, fenestrated bipolar in the left and a prograsp in the 4th arm.    There are small amount adhesions that were taken down.  The white line of Toldt was incised and the right colon was reflected.   The duodenum was kocherized.  The gonadal vein and IVC were exposed.  The tail of Gerota's fascia was lifted up and the ureter was identified.  The psoas muscle was exposed under the ureter and I followed this plane towards the renal hilum.  The hilar vessels were identified and exposed circumferentially.  At this point we identified 2 separate renal arteries which were each secured with a vessel loop using a Weck clip.  At this point, I incised Gerota's fascia and defatted the kidney.  As the mass was posterior, we had to defat the entire kidney.  We freed up the lower pole and laterally.  There was some difficulty freeing up the upper pole particularly the right lateral upper pole as there is some sticky fat density adherent to the capsule in this location.  We were able to finally mobilize the kidney circumferentially or it was only attached to its hilum.  We identified the right posterior interpolar mass with a large exophytic and endophytic component.     Once the tumor was exposed, intraoperative ultrasound was performed.  On ultrasound, the mass measured approximately 4 x 4 cm.  The mass was imaged and measured, and then excision markings were made.  2 separate bulldog clamps were placed one on the main artery and one on a smaller lower pole artery. Vein was left unclamped.  Warm ischemia time was clocked.  The renal mass was sharply excised.  There is no gross entry into the mass.  There was a small collecting system injury that we identified.  The mass is set aside and later placed into a 10 mm endocatch bag.  The first layer of the renorrhaphy was closed with running 3-0 V-Loc suture, anchored at each end with a Weck clip and Lapra-Ty.  We utilized 3 separate 3-0 V-Loc sutures to run the base.  There is still persistent bleeding.  We attempted to remove the bulldog clamps however there was pulsatile bleeding.  The clamps were replaced. A sliding clip renorrhaphy was performed to close the outer layer,  using interrupted 2-0 vicryl suture, Weck clips, and Lapra-Ty's.  A couple of the sliding clip Vicryls pulled through the Lapra-Ty.  The bulldog clamp was removed.  There is still some bleeding.  Bulldog was replaced.  We then used further interrupted Vicryl sutures however we cannot control the bleeding given the large renal defect.  The capsule just was not quite approximating well.  At this point the parenchyma looked healthy however there were some areas of concern. Warm ischemia time was approximately an hour.  At this point we elected to convert to a right robotic assisted laparoscopic radical nephrectomy as we were concerned for bleeding postoperatively.  We surveyed the hilum and there is another artery present.  We placed 2 large hemolock clips on the downside of the small artery and another on the left side.  We took this artery with scissors.  We then took the hilum en bloc which were 2 separate arteries and a renal vein.  We used the endovascular battery powered stapler.  The hilum was hemostatic.  The right adrenal gland was spared. We then use a stapler to take the right ureter. The kidney was placed in an Endo  Catch bag.  This was then sent for pathology.  I reduced the pneumoperitoneal pressure to 7 mmHg and confirmed hemostasis throughout the surgical field.  Liver was inspected to rule out injury.  Trocars were removed under direct vision.   We extended the right lower quadrant port site to remove the specimens.  We utilized a 0 Vicryl on a UR 6 needle to close the assistant 12 mm port.  The extraction site was closed with running 0 Vicryl and then #1 PDS.  All the wounds were copiously irrigated.  The skin edges were re approximated with 4-0 Monocryl.  Dermabond was applied.    All sponge, needle, and instrument counts were reported correct.  The patient was awakened from anesthesia and transferred to recovery in stable condition.  The patient tolerated the procedure well.  Operative events  were discussed with the patient's family.    Matt R. [facility]  Pager: [phone]

## 2020-02-06 NOTE — OR Nursing (Signed)
KIDNEY CLAMP OFF @ 1622

## 2020-02-06 NOTE — Anesthesia Preprocedure Evaluation (Signed)
Anesthesia Evaluation  Patient identified by MRN, date of birth, ID band Patient awake    Reviewed: Allergy & Precautions, NPO status , Patient's Chart, lab work & pertinent test results  Airway Mallampati: II  TM Distance: >3 FB Neck ROM: Full    Dental no notable dental hx.    Pulmonary neg pulmonary ROS,    Pulmonary exam normal breath sounds clear to auscultation       Cardiovascular hypertension, Normal cardiovascular exam Rhythm:Regular Rate:Normal     Neuro/Psych negative neurological ROS  negative psych ROS   GI/Hepatic Neg liver ROS, GERD  Medicated,  Endo/Other  negative endocrine ROS  Renal/GU negative Renal ROS  negative genitourinary   Musculoskeletal negative musculoskeletal ROS (+)   Abdominal   Peds negative pediatric ROS (+)  Hematology negative hematology ROS (+)   Anesthesia Other Findings   Reproductive/Obstetrics negative OB ROS                             Anesthesia Physical Anesthesia Plan  ASA: II  Anesthesia Plan: General   Post-op Pain Management:    Induction: Intravenous  PONV Risk Score and Plan: 2 and Ondansetron, Dexamethasone and Treatment may vary due to age or medical condition  Airway Management Planned: Oral ETT  Additional Equipment:   Intra-op Plan:   Post-operative Plan: Extubation in OR  Informed Consent: I have reviewed the patients History and Physical, chart, labs and discussed the procedure including the risks, benefits and alternatives for the proposed anesthesia with the patient or authorized representative who has indicated his/her understanding and acceptance.     Dental advisory given  Plan Discussed with: CRNA and Surgeon  Anesthesia Plan Comments:         Anesthesia Quick Evaluation

## 2020-02-06 NOTE — Anesthesia Procedure Notes (Signed)
Procedure Name: Intubation Date/Time: 02/06/2020 11:53 AM Performed by: Lollie Sails, CRNA Pre-anesthesia Checklist: Patient identified, Emergency Drugs available, Suction available, Patient being monitored and Timeout performed Patient Re-evaluated:Patient Re-evaluated prior to induction Oxygen Delivery Method: Circle system utilized Preoxygenation: Pre-oxygenation with 100% oxygen Induction Type: IV induction Ventilation: Mask ventilation without difficulty Laryngoscope Size: 3 and Mac Grade View: Grade I Tube type: Oral Number of attempts: 1 Airway Equipment and Method: Stylet Placement Confirmation: ETT inserted through vocal cords under direct vision,  positive ETCO2 and breath sounds checked- equal and bilateral Secured at: 23 cm Tube secured with: Tape Dental Injury: Teeth and Oropharynx as per pre-operative assessment  Comments: Intubated by Snead.

## 2020-02-06 NOTE — H&P (Signed)
Office Visit Report     01/05/2020   --------------------------------------------------------------------------------   Mitchell Ward  MRN: [id number]  DOB: [birth year], 64 year old Male  SSN:    PRIMARY CARE:  Mitchell Bihari, NP  REFERRING:  Mitchell Bihari, NP  PROVIDER:  Ellison Ward, M.D.  TREATING:  Mitchell Ward, M.D.  LOCATION:  [facility] [phone]     --------------------------------------------------------------------------------   CC/HPI: Mitchell Ward is a 64 year old male seen in consultation for a right renal mass to discuss definitive treatment options.   His right renal mass incidentally diagnosed on the right upper quadrant ultrasound. This was then followed by a renal protocol CT demonstrated a 3.5 x 3.3 x 3.3 cm enhancing neoplasm in the interpolar region of the right kidney. The right renal vein was patent. There was no sign of metastatic disease. Patient reports intermittent right upper quadrant discomfort. Of note the right upper quadrant ultrasound was negative for cholelithiasis. He denies significant flank pain. He denies hematuria. He has no prior malignancy. His uncle had prostate cancer. He has no prior abdominal surgeries. He does have a history of a left inguinal hernia repair which was done open. He is not taking any coagulation. ECOG performance status is 0. He has stable appetite and no weight loss.     ALLERGIES: NKDA    MEDICATIONS: Pantoprazole Sodium 40 mg tablet, delayed release  Pepcid 40 mg tablet  Pravastatin Sodium 20 mg tablet  Telmisartan 40 mg tablet     GU PSH: None   NON-GU PSH: Inguinal Hernia Repair < 5 yrs, Left     GU PMH: None   NON-GU PMH: GERD Hypercholesterolemia Hypertension    FAMILY HISTORY: 2 daughters - Daughter 1 son - Son Prostate Cancer - Uncle   SOCIAL HISTORY: Marital Status: Divorced Preferred Language: English; Ethnicity: Not Hispanic Or Latino; Race: White Current Smoking Status:  Patient has never smoked.   Tobacco Use Assessment Completed: Used Tobacco in last 30 days? Has never drank.  Does not drink caffeine.    REVIEW OF SYSTEMS:    GU Review Male:   Patient reports frequent urination, hard to postpone urination, and get up at night to urinate. Patient denies burning/ pain with urination, leakage of urine, stream starts and stops, trouble starting your stream, have to strain to urinate , erection problems, and penile pain.  Gastrointestinal (Upper):   Patient denies nausea, vomiting, and indigestion/ heartburn.  Gastrointestinal (Lower):   Patient denies diarrhea and constipation.  Constitutional:   Patient denies fever, night sweats, weight loss, and fatigue.  Skin:   Patient denies skin rash/ lesion and itching.  Eyes:   Patient denies blurred vision and double vision.  Ears/ Nose/ Throat:   Patient denies sore throat and sinus problems.  Hematologic/Lymphatic:   Patient denies swollen glands and easy bruising.  Cardiovascular:   Patient denies leg swelling and chest pains.  Respiratory:   Patient denies cough and shortness of breath.  Endocrine:   Patient denies excessive thirst.  Musculoskeletal:   Patient denies back pain and joint pain.  Neurological:   Patient denies headaches and dizziness.  Psychologic:   Patient denies depression and anxiety.   VITAL SIGNS:      01/05/2020 10:16 AM  Weight 145 lb / 65.77 kg  Height 64 in / 162.56 cm  BP 161/74 mmHg  Pulse 60 /min  BMI 24.9 kg/m   GU PHYSICAL EXAMINATION:    Scrotum: No lesions. No edema. No cysts.  No warts.  Epididymides: Right: no spermatocele, no masses, no cysts, no tenderness, no induration, no enlargement. Left: no spermatocele, no masses, no cysts, no tenderness, no induration, no enlargement.  Testes: No tenderness, no swelling, no enlargement left testes. No tenderness, no swelling, no enlargement right testes. Normal location left testes. Normal location right testes. No mass, no  cyst, no varicocele, no hydrocele left testes. No mass, no cyst, no varicocele, no hydrocele right testes.  Urethral Meatus: Normal size. No lesion, no wart, no discharge, no polyp. Normal location.  Penis: Uncircumcised, no foreskin warts, no cracks. No dorsal peyronie's plaques, no left corporal peyronie's plaques, no right corporal peyronie's plaques, no scarring, no shaft warts. No balanitis, no meatal stenosis.   Prostate: 40 gram or 2+ size. Enlarged right lobe, smooth, no nodules.  Seminal Vesicles: Nonpalpable.  Sphincter Tone: Normal sphincter. No rectal tenderness. No rectal mass.    MULTI-SYSTEM PHYSICAL EXAMINATION:    Constitutional: Well-nourished. No physical deformities. Normally developed. Good grooming.  Respiratory: No labored breathing, no use of accessory muscles.   Cardiovascular: Normal temperature, normal extremity pulses, no swelling, no varicosities.  Gastrointestinal: No mass, no tenderness, no rigidity, non obese abdomen.     Complexity of Data:  Source Of History:  Patient, Medical Record Summary  Records Review:   AUA Symptom Score, Previous Doctor Records  Urine Test Review:   Urinalysis  X-Ray Review: C.T. Abdomen/Pelvis: Reviewed Films. Reviewed Report.    Notes:                     CT A/P 01/02/2020:   CLINICAL DATA: 64 year old male with history of renal mass noted on  prior ultrasound examination. Follow-up study.   EXAM:  CT ABDOMEN AND PELVIS WITHOUT AND WITH CONTRAST   TECHNIQUE:  Multidetector CT imaging of the abdomen and pelvis was performed  following the standard protocol before and following the bolus  administration of intravenous contrast.   CONTRAST: 100 mL of Isovue 370.   COMPARISON: No prior abdominal CT. Abdominal ultrasound 01/01/2020.   FINDINGS:  Lower chest: Aortic atherosclerosis.   Hepatobiliary: No suspicious cystic or solid hepatic lesions. No  intra or extrahepatic biliary ductal dilatation. Gallbladder is  normal  in appearance.   Pancreas: No pancreatic mass. No pancreatic ductal dilatation. No  pancreatic or peripancreatic fluid collections or inflammatory  changes.   Spleen: Unremarkable.   Adrenals/Urinary Tract: On precontrast images there is some faint  increased attenuation associated with the medullary pyramids, which  may indicate underlying mild medullary nephrocalcinosis. No discrete  calculi are confidently identified within the collecting system of  either kidney, along the course of either ureter, or within the  lumen of the urinary bladder. No hydroureteronephrosis. The lesion  of concern in the interpolar region of the right kidney measures 3.5  x 3.3 x 3.3 cm (axial image 50 of series 8 and coronal image 62 of  series 607). And is intermediate attenuation (33 HU) on precontrast  images, and enhance is (93 HU), indicative of renal neoplasm. This  lesion is encapsulated within Gerota's fascia and is well separated  from the right renal vein which is widely patent. Left kidney and  bilateral adrenal glands are normal in appearance. No  hydroureteronephrosis. Urinary bladder is normal in appearance.   Stomach/Bowel: The appearance of the stomach is normal. No  pathologic dilatation of small bowel or colon. Numerous colonic  diverticulae are noted, without surrounding inflammatory changes to  suggest an acute diverticulitis  at this time. Normal appendix.   Vascular/Lymphatic: Aortic atherosclerosis, without evidence of  aneurysm or dissection in the abdominal or pelvic vasculature. No  lymphadenopathy noted in the abdomen or pelvis.   Reproductive: Prostate gland and seminal vesicles are unremarkable  in appearance.   Other: No significant volume of ascites. No pneumoperitoneum.   Musculoskeletal: There are no aggressive appearing lytic or blastic  lesions noted in the visualized portions of the skeleton. In the  adductor compartment of the upper left thigh there is a   well-circumscribed fatty attenuation lesion measuring 7.0 x 2.7 cm  (axial image 147 of series 5), likely to represent a lipoma.   IMPRESSION:  1. 3.5 x 3.3 x 3.3 cm enhancing neoplasm in the interpolar region of  the right kidney compatible with a solid renal neoplasm. This lesion  is currently encapsulated within Gerota's fascia, is separate from  the right renal vein which is patent, and is not associated with  definite signs of metastatic disease in the abdomen or pelvis.  Urologic consultation is recommended.  2. Severe colonic diverticulosis without evidence of acute  diverticulitis at this time.  3. Aortic atherosclerosis.  4. Additional incidental findings, as above.    Electronically Signed  By: Vinnie Langton M.D.  On: 01/02/2020 14:05   PROCEDURES:          Urinalysis Dipstick Dipstick Cont'd  Color: Yellow Bilirubin: Neg mg/dL  Appearance: Clear Ketones: Neg mg/dL  Specific Gravity: 1.015 Blood: Neg ery/uL  pH: 7.0 Protein: Neg mg/dL  Glucose: Neg mg/dL Urobilinogen: 0.2 mg/dL    Nitrites: Neg    Leukocyte Esterase: Neg leu/uL    ASSESSMENT:      ICD-10 Details  1 GU:   Right renal neoplasm - D49.511   2   Encounter for Prostate Cancer screening - Z12.5    PLAN:           Orders Labs CBC with Diff, CMP, LDH, PSA          Document Letter(s):  Created for Patient: Clinical Summary         Notes:   New right renal mass diagnosed on 01/02/2020  -Location: [place]  -Nephrometry Score: 7P  -Hb: pending  -ECOG PS: 0  -Creatinine: pending  -Mediastinal LAD? No  -Liver Involvement? No   Current Disease Status: New right renal mass  Current Treatment Plan: Right robotic assisted laparoscopic partial nephrectomy with intraoperative ultrasound   Last Imaging: 01/02/2020   We discussed the fact that 80% of small renal masses are found to be malignant and that the primary treatment option is surgical excision via either open or laparoscopic approach.  Other treatment options discussed include ablative therapies (cryoablation v radiofrequency ablation) and active surveillance. At this point in time, the patient has elected to proceed with right robotic assisted laparoscopic partial nephrectomy. Discussed risks and benefits including risks of hemorrhage requiring intervention, infection, damage to surrounding organs, MI, PE and death for anesthesia. Pt understands and consents to above. Will schedule with one of my partners. Depending on schedule availability, will see again for preop.   2. 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 PSA today.      Signed by Mitchell Ward, M.D. on 01/05/20 at 2:26 PM (EDT)    The information contained in this medical record document is considered private and confidential patient information. This information can only be used for the medical diagnosis and/or medical services that are being provided by  the patient's selected caregivers. This information can only be distributed outside of the patient's care if the patient agrees and signs waivers of authorization for this information to be sent to an outside source or route.   Urology Preoperative H&P   Chief Complaint: Right renal mass  History of Present Illness: Mitchell Ward is a 64 y.o. male with a right renal mass here for R RALPNx. He denies fevers or chills.    Past Medical History:  Diagnosis Date  . Cancer ([place])   . GERD (gastroesophageal reflux disease)   . Hypertension     Past Surgical History:  Procedure Laterality Date  . 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 HERNIA REPAIR Left 2008    Allergies: No Known Allergies  No family history on file.  Social History:  reports that he has never smoked. He has never used smokeless tobacco. He reports that he does not drink alcohol and does not use drugs.  ROS: A complete review of systems was performed.  All systems are  negative except for pertinent findings as noted.  Physical Exam:  Vital signs in last 24 hours:   Constitutional:  Alert and oriented, No acute distress Cardiovascular: Regular rate and rhythm Respiratory: Normal respiratory effort, Lungs clear bilaterally GI: Abdomen is soft, nontender, nondistended, no abdominal masses GU: No CVA tenderness Lymphatic: No lymphadenopathy Neurologic: Grossly intact, no focal deficits Psychiatric: Normal mood and affect  Laboratory Data:  No results for input(s): WBC, HGB, HCT, PLT in the last 72 hours.  No results for input(s): NA, K, CL, GLUCOSE, BUN, CALCIUM, CREATININE in the last 72 hours.  Invalid input(s): CO3   No results found for this or any previous visit (from the past 24 hour(s)). Recent Results (from the past 240 hour(s))  SARS CORONAVIRUS 2 (TAT 6-24 HRS) Nasopharyngeal Nasopharyngeal Swab     Status: None   Collection Time: 02/02/20 10:13 AM   Specimen: Nasopharyngeal Swab  Result Value Ref Range Status   SARS Coronavirus 2 NEGATIVE NEGATIVE Final    Comment: (NOTE) SARS-CoV-2 target nucleic acids are NOT DETECTED.  The SARS-CoV-2 RNA is generally detectable in upper and lower respiratory specimens during the acute phase of infection. Negative results do not preclude SARS-CoV-2 infection, do not rule out co-infections with other pathogens, and should not be used as the sole basis for treatment or other patient management decisions. Negative results must be combined with clinical observations, patient history, and epidemiological information. The expected result is Negative.  Fact Sheet for Patients: SugarRoll.be  Fact Sheet for Healthcare Providers: https://www.woods-mathews.com/  This test is not yet approved or cleared by the [place] FDA and  has been authorized for detection and/or diagnosis of SARS-CoV-2 by FDA under an Emergency Use Authorization (EUA). This EUA will  remain  in effect (meaning this test can be used) for the duration of the COVID-19 declaration under Se ction 564(b)(1) of the Act, 21 U.S.C. section 360bbb-3(b)(1), unless the authorization is terminated or revoked sooner.  Performed at [facility], [place] [address], [place], [place] [address]     Renal Function: Recent Labs    01/30/20 1420  CREATININE 1.07   Estimated Creatinine Clearance: 57.6 mL/min (by C-G formula based on SCr of 1.07 mg/dL).  Radiologic Imaging: No results found.  I independently reviewed the above imaging studies.  Assessment and Plan Mitchell Ward is a 64 y.o. male with a right renal mass here for R RALPNx.  We  discussed proceeding with a right robot-assisted laparoscopic partial nephrectomy as the tumor looks amenable to a robotic approach.  Risks and benefits discussed including infection, bleeding (Intra-op or postop with fistula or pseudoaneurysm), transfusion, urine leak, higher complication rate with partial nephrectomy, possible conversion to open partial and/or total nephrectomy with subsequent diminished preservation of renal function, possible injury to surrounding organs, possible benign path or positive margins, and global anesthesia risk including but not limited to CVA, MI, DVT, PE, pneumonia and death. The patient understands and desires to proceed.  Matt R. Elaine Middleton MD 02/06/2020, 7:51 AM  [facility] Pager: [phone]): [phone]

## 2020-02-06 NOTE — Progress Notes (Signed)
Day of Surgery Subjective: Pain controlled. No nausea or emesis. Tolerating foley.  Objective: Vital signs in last 24 hours: Temp:  [98.3 F (36.8 C)-99.6 F (37.6 C)] 99.6 F (37.6 C) (10/05 1812) Pulse Rate:  [62-108] 95 (10/05 1930) Resp:  [14-23] 14 (10/05 1930) BP: (113-159)/(62-92) 113/62 (10/05 1930) SpO2:  [100 %] 100 % (10/05 1930) Weight:  [63.5 kg] 63.5 kg (10/05 1025)  Intake/Output from previous day: No intake/output data recorded. Intake/Output this shift: No intake/output data recorded.  Physical Exam:  General: Alert and oriented CV: RRR Lungs: Clear Abdomen: Soft, ND, ATTP; inc c/d/I Ext: NT, No erythema, all compartments soft  Lab Results: Recent Labs    02/06/20 1843  HGB 9.5*  HCT 28.8*   BMET Recent Labs    02/06/20 1843  CREATININE 1.41*     Studies/Results: No results found.  Assessment/Plan: 1. Right renal mass s/p right robotic assisted laparoscopic nephrectomy 02/06/2020 -Pain control prn -Sips of clears -MIVF -Labs in AM -OOB, amb, IS, SQH   LOS: 0 days   [name] 02/06/2020, 8:02 PM Matt R. [facility]  Pager: [phone]

## 2020-02-06 NOTE — OR Nursing (Signed)
KIDNEY CLAMP ON @ 1521

## 2020-02-06 NOTE — Transfer of Care (Signed)
Immediate Anesthesia Transfer of Care Note  Patient: Mitchell Ward  Procedure(s) Performed: XI ROBOTIC ASSITED LAPAROSCOPIC   NEPHRECTOMY (Right Abdomen) INTRA-OPERATIVE ULTRASOUND (Right Abdomen)  Patient Location: PACU  Anesthesia Type:General  Level of Consciousness: awake, sedated and responds to stimulation  Airway & Oxygen Therapy: Patient Spontanous Breathing and Patient connected to face mask oxygen  Post-op Assessment: Report given to RN and Post -op Vital signs reviewed and stable  Post vital signs: Reviewed and stable  Last Vitals:  Vitals Value Taken Time  BP 118/70 02/06/20 1812  Temp    Pulse 106 02/06/20 1814  Resp 15 02/06/20 1814  SpO2 100 % 02/06/20 1814  Vitals shown include unvalidated device data.  Last Pain:  Vitals:   02/06/20 1046  TempSrc: Oral         Complications: No complications documented.

## 2020-02-07 LAB — BASIC METABOLIC PANEL
Anion gap: 10 (ref 5–15)
BUN: 23 mg/dL (ref 8–23)
CO2: 23 mmol/L (ref 22–32)
Calcium: 8.3 mg/dL — ABNORMAL LOW (ref 8.9–10.3)
Chloride: 104 mmol/L (ref 98–111)
Creatinine, Ser: 1.73 mg/dL — ABNORMAL HIGH (ref 0.61–1.24)
GFR calc non Af Amer: 41 mL/min — ABNORMAL LOW (ref >60)
Glucose, Bld: 171 mg/dL — ABNORMAL HIGH (ref 70–99)
Potassium: 4.6 mmol/L (ref 3.5–5.1)
Sodium: 137 mmol/L (ref 135–145)

## 2020-02-07 LAB — CBC
HCT: 28 % — ABNORMAL LOW (ref 39.0–52.0)
Hemoglobin: 9 g/dL — ABNORMAL LOW (ref 13.0–17.0)
MCH: 31.6 pg (ref 26.0–34.0)
MCHC: 32.1 g/dL (ref 30.0–36.0)
MCV: 98.2 fL (ref 80.0–100.0)
Platelets: 215 10*3/uL (ref 150–400)
RBC: 2.85 MIL/uL — ABNORMAL LOW (ref 4.22–5.81)
RDW: 13.1 % (ref 11.5–15.5)
WBC: 16.1 10*3/uL — ABNORMAL HIGH (ref 4.0–10.5)
nRBC: 0 % (ref 0.0–0.2)

## 2020-02-07 MED ORDER — OXYCODONE-ACETAMINOPHEN 5-325 MG PO TABS
ORAL_TABLET | ORAL | Status: AC
  Filled 2020-02-07: qty 1

## 2020-02-07 MED ORDER — OXYCODONE-ACETAMINOPHEN 5-325 MG PO TABS
ORAL_TABLET | ORAL | Status: AC
  Administered 2020-02-07: 1 via ORAL
  Filled 2020-02-07: qty 1

## 2020-02-07 MED ORDER — HEPARIN SODIUM (PORCINE) 5000 UNIT/ML IJ SOLN
INTRAMUSCULAR | Status: AC
  Administered 2020-02-07: 5000 [IU] via SUBCUTANEOUS
  Filled 2020-02-07: qty 1

## 2020-02-07 MED ORDER — FAMOTIDINE 20 MG PO TABS
40.0000 mg | ORAL_TABLET | Freq: Every day | ORAL | Status: DC
  Administered 2020-02-08 – 2020-02-10 (×3): 40 mg via ORAL
  Filled 2020-02-07 (×3): qty 2

## 2020-02-07 NOTE — Plan of Care (Signed)
  Problem: Education: °Goal: Knowledge of the prescribed therapeutic regimen will improve °Outcome: Progressing °  °Problem: Bowel/Gastric: °Goal: Gastrointestinal status for postoperative course will improve °Outcome: Progressing °  °Problem: Clinical Measurements: °Goal: Postoperative complications will be avoided or minimized °Outcome: Progressing °  °Problem: Respiratory: °Goal: Ability to achieve and maintain a regular respiratory rate will improve °Outcome: Progressing °  °Problem: Skin Integrity: °Goal: Demonstration of wound healing without infection will improve °Outcome: Progressing °  °Problem: Urinary Elimination: °Goal: Ability to avoid or minimize complications of infection will improve °Outcome: Progressing °Goal: Ability to achieve and maintain urine output will improve °Outcome: Progressing °  °

## 2020-02-07 NOTE — Progress Notes (Addendum)
1 Day Post-Op Subjective: Pain controlled. No nausea or emesis. Tolerating clears and crackers. Tolerating foley.   Objective: Vital signs in last 24 hours: Temp:  [98 F (36.7 C)-99.6 F (37.6 C)] 98.7 F (37.1 C) (10/06 0500) Pulse Rate:  [62-108] 88 (10/06 0600) Resp:  [14-23] 18 (10/06 0600) BP: (107-159)/(62-92) 126/73 (10/06 0600) SpO2:  [97 %-100 %] 100 % (10/06 0600) Weight:  [63.5 kg] 63.5 kg (10/05 1025)  Intake/Output from previous day: 10/05 0701 - 10/06 0700 In: 5600.6 [P.O.:600; I.V.:4400.6; IV Piggyback:600] Out: 1660 [Urine:810; Blood:850] Intake/Output this shift: Total I/O In: 1925 [P.O.:600; I.V.:1325] Out: 580 [Urine:580]   Physical Exam:  General: Alert and oriented CV: RRR Lungs: Clear Abdomen: Soft, ND, ATTP; inc c/d/I Ext: NT, No erythema  Lab Results: Recent Labs    02/06/20 1843 02/07/20 0520  HGB 9.5* 9.0*  HCT 28.8* 28.0*   BMET Recent Labs    02/06/20 1843 02/07/20 0520  NA  --  137  K  --  4.6  CL  --  104  CO2  --  23  GLUCOSE  --  171*  BUN  --  23  CREATININE 1.41* 1.73*  CALCIUM  --  8.3*     Studies/Results: No results found.  Assessment/Plan: 1. Right renal mass s/p right robotic assisted laparoscopic nephrectomy 02/06/2020 -Pain control prn -Advance to regular diet -MIVF -Hgb 9.0 stable from post-op 9.5; preop 13.5; recheck HH at 11AM -Reactive leukocytosis to 16.1 -Cr rise to 1.73. Expect should downtrend. -Adequate UOP. 56ml/hr last 2 hours. -Void trial -OOB, amb, IS, SQH -Will re-evaluate this PM. Likely home tomorrow.   LOS: 1 day   [name] 02/07/2020, 6:47 AM Matt R. [facility]  Pager: [phone]

## 2020-02-07 NOTE — Progress Notes (Signed)
PM Note  C/o abdominal pain, pain controlled. No nausea or emesis. No flatus yet. Has not ambulated. Passed VT.  Temp:  [98 F (36.7 C)-99.6 F (37.6 C)] 98.3 F (36.8 C) (10/06 1129) Pulse Rate:  [81-108] 81 (10/06 1129) Resp:  [14-23] 18 (10/06 1129) BP: (107-127)/(62-80) 121/68 (10/06 1129) SpO2:  [97 %-100 %] 98 % (10/06 1130)  Intake/Output this shift: Total I/O In: 125 [I.V.:125] Out: 900 [Urine:900]  Physical Exam:  General: Alert and oriented CV: RRR Lungs: Clear Abdomen: Soft, ND, ATTP; inc c/d/I Ext: NT, No erythema  Assessment/Plan: 1. Right renal mass s/p right robotic assisted laparoscopic nephrectomy 02/06/2020 -Pain control prn -Regular diet as tolerated -MIVF -Adequate UOP -Void trial -OOB, amb, IS, SQH -Likely home tomorrow   LOS: 1 day   Mitchell Ward 02/07/2020, 1:28 PM Matt R. [facility]  Pager: [phone]

## 2020-02-08 ENCOUNTER — Other Ambulatory Visit: Payer: Self-pay

## 2020-02-08 ENCOUNTER — Encounter (HOSPITAL_COMMUNITY): Payer: Self-pay | Admitting: Urology

## 2020-02-08 LAB — CBC WITH DIFFERENTIAL/PLATELET
Abs Immature Granulocytes: 0.08 10*3/uL — ABNORMAL HIGH (ref 0.00–0.07)
Basophils Absolute: 0 10*3/uL (ref 0.0–0.1)
Basophils Relative: 0 %
Eosinophils Absolute: 0.1 10*3/uL (ref 0.0–0.5)
Eosinophils Relative: 0 %
HCT: 24.5 % — ABNORMAL LOW (ref 39.0–52.0)
Hemoglobin: 7.8 g/dL — ABNORMAL LOW (ref 13.0–17.0)
Immature Granulocytes: 1 %
Lymphocytes Relative: 10 %
Lymphs Abs: 1.6 10*3/uL (ref 0.7–4.0)
MCH: 31.3 pg (ref 26.0–34.0)
MCHC: 31.8 g/dL (ref 30.0–36.0)
MCV: 98.4 fL (ref 80.0–100.0)
Monocytes Absolute: 1.3 10*3/uL — ABNORMAL HIGH (ref 0.1–1.0)
Monocytes Relative: 8 %
Neutro Abs: 12.4 10*3/uL — ABNORMAL HIGH (ref 1.7–7.7)
Neutrophils Relative %: 81 %
Platelets: 190 10*3/uL (ref 150–400)
RBC: 2.49 MIL/uL — ABNORMAL LOW (ref 4.22–5.81)
RDW: 13.3 % (ref 11.5–15.5)
WBC: 15.4 10*3/uL — ABNORMAL HIGH (ref 4.0–10.5)
nRBC: 0 % (ref 0.0–0.2)

## 2020-02-08 LAB — BASIC METABOLIC PANEL
Anion gap: 9 (ref 5–15)
BUN: 18 mg/dL (ref 8–23)
CO2: 25 mmol/L (ref 22–32)
Calcium: 8.9 mg/dL (ref 8.9–10.3)
Chloride: 106 mmol/L (ref 98–111)
Creatinine, Ser: 1.69 mg/dL — ABNORMAL HIGH (ref 0.61–1.24)
GFR calc non Af Amer: 42 mL/min — ABNORMAL LOW (ref >60)
Glucose, Bld: 125 mg/dL — ABNORMAL HIGH (ref 70–99)
Potassium: 4.5 mmol/L (ref 3.5–5.1)
Sodium: 140 mmol/L (ref 135–145)

## 2020-02-08 LAB — HEMOGLOBIN AND HEMATOCRIT, BLOOD
HCT: 25 % — ABNORMAL LOW (ref 39.0–52.0)
Hemoglobin: 8 g/dL — ABNORMAL LOW (ref 13.0–17.0)

## 2020-02-08 LAB — MAGNESIUM: Magnesium: 2.2 mg/dL (ref 1.7–2.4)

## 2020-02-08 LAB — PHOSPHORUS: Phosphorus: 1.7 mg/dL — ABNORMAL LOW (ref 2.5–4.6)

## 2020-02-08 MED ORDER — SODIUM CHLORIDE 0.9% FLUSH
3.0000 mL | Freq: Two times a day (BID) | INTRAVENOUS | Status: DC
  Administered 2020-02-08 – 2020-02-09 (×4): 3 mL via INTRAVENOUS

## 2020-02-08 MED ORDER — SODIUM CHLORIDE 0.9% FLUSH: 3.0000 mL | INTRAVENOUS | Status: DC | PRN

## 2020-02-08 MED ORDER — SODIUM CHLORIDE 0.9 % IV SOLN: 250.0000 mL | INTRAVENOUS | Status: DC | PRN

## 2020-02-08 NOTE — Progress Notes (Signed)
PM Note  Feels well. Abdominal pain controlled. No nausea or emesis. No flatus yet. Ambulating.  Vital signs in last 24 hours: Temp:  [98.2 F (36.8 C)-98.5 F (36.9 C)] 98.5 F (36.9 C) (10/07 1250) Pulse Rate:  [70-89] 70 (10/07 1250) Resp:  [16-20] 18 (10/07 1250) BP: (110-139)/(69-84) 123/76 (10/07 1250) SpO2:  [94 %-99 %] 97 % (10/07 1250)  Intake/Output from previous day: 10/06 0701 - 10/07 0700 In: 1595.5 [P.O.:460; I.V.:1135.5] Out: 3600 [Urine:3600] Intake/Output this shift: Total I/O In: 600 [P.O.:600] Out: -   Physical Exam:  General: Alert and oriented CV: RRR Lungs: Clear Abdomen: Soft, ND, ATTP; inc c/d/I; increased ecchymosis around RLQ incision Ext: NT, No erythema  Lab Results: Recent Labs    02/07/20 0520 02/08/20 0744 02/08/20 1152  HGB 9.0* 7.8* 8.0*  HCT 28.0* 24.5* 25.0*   BMET Recent Labs    02/07/20 0520 02/08/20 0744  NA 137 140  K 4.6 4.5  CL 104 106  CO2 23 25  GLUCOSE 171* 125*  BUN 23 18  CREATININE 1.73* 1.69*  CALCIUM 8.3* 8.9     Studies/Results: No results found.  Assessment/Plan: 1. Right renal mass s/p right robotic assisted laparoscopic nephrectomy 02/06/2020 -Pain control prn -Regular diet as tolerated -MIVF -Adequate UOP -H/H drop, likely dilutional. Stable this PM from AM. Recheck tomorrow. -OOB, amb, IS, SQH -Pt prefers to stay one more night -Likely home tomorrow   LOS: 2 days   [name] 02/08/2020, 1:50 PM Matt R. [facility]  Pager: [phone]

## 2020-02-08 NOTE — Progress Notes (Signed)
2 Days Post-Op Subjective: Pain controlled, still complaining of right lower abdominal pain. No nausea or emesis. Tolerating diet. Voiding without difficulty. Afebrile.   Objective: Vital signs in last 24 hours: Temp:  [98.2 F (36.8 C)-99.4 F (37.4 C)] 98.3 F (36.8 C) (10/07 0427) Pulse Rate:  [76-89] 76 (10/07 0427) Resp:  [16-20] 20 (10/07 0427) BP: (110-139)/(68-84) 115/69 (10/07 0427) SpO2:  [94 %-100 %] 95 % (10/07 0427)  Intake/Output from previous day: 10/06 0701 - 10/07 0700 In: 1595.5 [P.O.:460; I.V.:1135.5] Out: 3600 [Urine:3600] Intake/Output this shift: No intake/output data recorded.  Physical Exam:  General: Alert and oriented CV: RRR Lungs: Clear Abdomen: Soft, ND, ATTP; inc c/d/I; more ecchymosis around right lower quadrant incision Ext: NT, No erythema  Lab Results: Recent Labs    02/06/20 1843 02/07/20 0520  HGB 9.5* 9.0*  HCT 28.8* 28.0*   BMET Recent Labs    02/06/20 1843 02/07/20 0520  NA  --  137  K  --  4.6  CL  --  104  CO2  --  23  GLUCOSE  --  171*  BUN  --  23  CREATININE 1.41* 1.73*  CALCIUM  --  8.3*     Studies/Results: No results found.  Assessment/Plan: 1. Right renal mass s/p right robotic assisted laparoscopic nephrectomy 02/06/2020 -Pain control prn -Regular diet as tolerated -[place] lock -Adequate UOP -F/u AM labs -OOB, amb, IS, SQH -Likely home today. Will re-eval this PM.   LOS: 2 days   [name] 02/08/2020, 7:24 AM Matt R. [facility]  Pager: [phone]

## 2020-02-09 LAB — BASIC METABOLIC PANEL
Anion gap: 6 (ref 5–15)
BUN: 17 mg/dL (ref 8–23)
CO2: 27 mmol/L (ref 22–32)
Calcium: 8.7 mg/dL — ABNORMAL LOW (ref 8.9–10.3)
Chloride: 104 mmol/L (ref 98–111)
Creatinine, Ser: 1.7 mg/dL — ABNORMAL HIGH (ref 0.61–1.24)
GFR calc non Af Amer: 41 mL/min — ABNORMAL LOW (ref >60)
Glucose, Bld: 108 mg/dL — ABNORMAL HIGH (ref 70–99)
Potassium: 4.4 mmol/L (ref 3.5–5.1)
Sodium: 137 mmol/L (ref 135–145)

## 2020-02-09 LAB — CBC WITH DIFFERENTIAL/PLATELET
Abs Immature Granulocytes: 0.05 10*3/uL (ref 0.00–0.07)
Abs Immature Granulocytes: 0.07 10*3/uL (ref 0.00–0.07)
Basophils Absolute: 0 10*3/uL (ref 0.0–0.1)
Basophils Absolute: 0.1 10*3/uL (ref 0.0–0.1)
Basophils Relative: 0 %
Basophils Relative: 0 %
Eosinophils Absolute: 0.4 10*3/uL (ref 0.0–0.5)
Eosinophils Absolute: 0.3 10*3/uL (ref 0.0–0.5)
Eosinophils Relative: 3 %
Eosinophils Relative: 2 %
HCT: 23.9 % — ABNORMAL LOW (ref 39.0–52.0)
HCT: 24.4 % — ABNORMAL LOW (ref 39.0–52.0)
Hemoglobin: 7.8 g/dL — ABNORMAL LOW (ref 13.0–17.0)
Hemoglobin: 7.9 g/dL — ABNORMAL LOW (ref 13.0–17.0)
Immature Granulocytes: 0 %
Immature Granulocytes: 1 %
Lymphocytes Relative: 15 %
Lymphocytes Relative: 12 %
Lymphs Abs: 2.1 10*3/uL (ref 0.7–4.0)
Lymphs Abs: 1.7 10*3/uL (ref 0.7–4.0)
MCH: 32.2 pg (ref 26.0–34.0)
MCH: 31.7 pg (ref 26.0–34.0)
MCHC: 32.6 g/dL (ref 30.0–36.0)
MCHC: 32.4 g/dL (ref 30.0–36.0)
MCV: 98.8 fL (ref 80.0–100.0)
MCV: 98 fL (ref 80.0–100.0)
Monocytes Absolute: 1.2 10*3/uL — ABNORMAL HIGH (ref 0.1–1.0)
Monocytes Absolute: 1.2 10*3/uL — ABNORMAL HIGH (ref 0.1–1.0)
Monocytes Relative: 9 %
Monocytes Relative: 8 %
Neutro Abs: 10 10*3/uL — ABNORMAL HIGH (ref 1.7–7.7)
Neutro Abs: 10.9 10*3/uL — ABNORMAL HIGH (ref 1.7–7.7)
Neutrophils Relative %: 73 %
Neutrophils Relative %: 77 %
Platelets: 199 10*3/uL (ref 150–400)
Platelets: 192 10*3/uL (ref 150–400)
RBC: 2.42 MIL/uL — ABNORMAL LOW (ref 4.22–5.81)
RBC: 2.49 MIL/uL — ABNORMAL LOW (ref 4.22–5.81)
RDW: 13.2 % (ref 11.5–15.5)
RDW: 13 % (ref 11.5–15.5)
WBC: 13.8 10*3/uL — ABNORMAL HIGH (ref 4.0–10.5)
WBC: 14.3 10*3/uL — ABNORMAL HIGH (ref 4.0–10.5)
nRBC: 0 % (ref 0.0–0.2)
nRBC: 0 % (ref 0.0–0.2)

## 2020-02-09 LAB — SURGICAL PATHOLOGY

## 2020-02-09 MED ORDER — BISACODYL 10 MG RE SUPP: 10.0000 mg | Freq: Once | RECTAL | Status: DC

## 2020-02-09 NOTE — Progress Notes (Signed)
3 Days Post-Op Subjective: Pain controlled. Has not taken percocet since 2AM. Ambulated well yesterday, not yet today. No nausea or emesis. Tolerating diet.  Objective: Vital signs in last 24 hours: Temp:  [98.3 F (36.8 C)-99.3 F (37.4 C)] 98.3 F (36.8 C) (10/08 0509) Pulse Rate:  [70-90] 72 (10/08 0509) Resp:  [18] 18 (10/08 0509) BP: (118-123)/(72-83) 118/72 (10/08 0509) SpO2:  [90 %-97 %] 90 % (10/08 0509)  Intake/Output from previous day: 10/07 0701 - 10/08 0700 In: 1320 [P.O.:1320] Out: 700 [Urine:700] Intake/Output this shift: No intake/output data recorded.  Physical Exam:  General: Alert and oriented CV: RRR Lungs: Clear Abdomen: Soft, ND, ATTP; inc c/d/I; enlarging ecchymosis around [facility] incision tracking up right flank and down groin to scrotum Ext: NT, No erythema  Lab Results: Recent Labs    02/08/20 0744 02/08/20 1152 02/09/20 0623  HGB 7.8* 8.0* 7.8*  HCT 24.5* 25.0* 23.9*   BMET Recent Labs    02/08/20 0744 02/09/20 0623  NA 140 137  K 4.5 4.4  CL 106 104  CO2 25 27  GLUCOSE 125* 108*  BUN 18 17  CREATININE 1.69* 1.70*  CALCIUM 8.9 8.7*     Studies/Results: No results found.  Assessment/Plan: 1. Right renal mass s/p right robotic assisted laparoscopic nephrectomy 02/06/2020 -Pain control prn -Regular diet as tolerated -Adequate UOP -H/H stable this AM at 7.8 from yesterday 7.8. Recheck HH at noon. If remains stable, will discharge home. If dropping, will obtain CT A/P. -OOB, amb, IS, SQH   LOS: 3 days   [name] 02/09/2020, 7:53 AM [facility]  Pager: [phone]

## 2020-02-09 NOTE — Progress Notes (Signed)
PM Note  Pain controlled. No nausea or emesis. Passing flatus. No BM. Voiding. Ambulating. Experiencing some right flank pain, more bruising. He is concerned that he has not had a BM. Feels bloated.  Objective: Vital signs in last 24 hours: Temp:  [98.3 F (36.8 C)-99.3 F (37.4 C)] 98.3 F (36.8 C) (10/08 0509) Pulse Rate:  [72-90] 72 (10/08 0509) Resp:  [18] 18 (10/08 0509) BP: (118-122)/(72-83) 118/72 (10/08 0509) SpO2:  [90 %-91 %] 90 % (10/08 0509)  Intake/Output from previous day: 10/07 0701 - 10/08 0700 In: 1320 [P.O.:1320] Out: 700 [Urine:700] Intake/Output this shift: Total I/O In: 3 [I.V.:3] Out: -   Physical Exam:  General: Alert and oriented CV: RRR Lungs: Clear Abdomen: Soft, ND, ATTP; inc c/d/I; stable ecchymosis around R gibson incision, tracking inferiorly toward scrotum and superiorly along right flank Ext: NT, No erythema  Lab Results: Recent Labs    02/08/20 1152 02/09/20 0623 02/09/20 1158  HGB 8.0* 7.8* 7.9*  HCT 25.0* 23.9* 24.4*   BMET Recent Labs    02/08/20 0744 02/09/20 0623  NA 140 137  K 4.5 4.4  CL 106 104  CO2 25 27  GLUCOSE 125* 108*  BUN 18 17  CREATININE 1.69* 1.70*  CALCIUM 8.9 8.7*     Studies/Results: No results found.  Assessment/Plan: 1. Right renal mass s/p right robotic assisted laparoscopic nephrectomy 02/06/2020 -Pain control prn -Regular diet as tolerated -Adequate UOP -H/H stable at 7.8 -He is concerned he has not had a BM. Instructed RN to give dulcolax -OOB, amb, IS, SQH -D/c home tomorrow   LOS: 3 days   [name] 02/09/2020, 1:40 PM [facility]  Pager: [phone]

## 2020-02-10 LAB — CBC WITH DIFFERENTIAL/PLATELET
Abs Immature Granulocytes: 0.04 10*3/uL (ref 0.00–0.07)
Basophils Absolute: 0 10*3/uL (ref 0.0–0.1)
Basophils Relative: 0 %
Eosinophils Absolute: 0.4 10*3/uL (ref 0.0–0.5)
Eosinophils Relative: 4 %
HCT: 25 % — ABNORMAL LOW (ref 39.0–52.0)
Hemoglobin: 8.1 g/dL — ABNORMAL LOW (ref 13.0–17.0)
Immature Granulocytes: 0 %
Lymphocytes Relative: 14 %
Lymphs Abs: 1.5 10*3/uL (ref 0.7–4.0)
MCH: 31.6 pg (ref 26.0–34.0)
MCHC: 32.4 g/dL (ref 30.0–36.0)
MCV: 97.7 fL (ref 80.0–100.0)
Monocytes Absolute: 1.1 10*3/uL — ABNORMAL HIGH (ref 0.1–1.0)
Monocytes Relative: 10 %
Neutro Abs: 7.8 10*3/uL — ABNORMAL HIGH (ref 1.7–7.7)
Neutrophils Relative %: 72 %
Platelets: 226 10*3/uL (ref 150–400)
RBC: 2.56 MIL/uL — ABNORMAL LOW (ref 4.22–5.81)
RDW: 12.7 % (ref 11.5–15.5)
WBC: 10.9 10*3/uL — ABNORMAL HIGH (ref 4.0–10.5)
nRBC: 0 % (ref 0.0–0.2)

## 2020-02-10 LAB — BASIC METABOLIC PANEL
Anion gap: 10 (ref 5–15)
BUN: 17 mg/dL (ref 8–23)
CO2: 27 mmol/L (ref 22–32)
Calcium: 9.3 mg/dL (ref 8.9–10.3)
Chloride: 101 mmol/L (ref 98–111)
Creatinine, Ser: 1.56 mg/dL — ABNORMAL HIGH (ref 0.61–1.24)
GFR, Estimated: 46 mL/min — ABNORMAL LOW (ref >60)
Glucose, Bld: 104 mg/dL — ABNORMAL HIGH (ref 70–99)
Potassium: 4.3 mmol/L (ref 3.5–5.1)
Sodium: 138 mmol/L (ref 135–145)

## 2020-02-10 NOTE — Plan of Care (Signed)
°  Problem: Education: Goal: Knowledge of General Education information will improve Description: Including pain rating scale, medication(s)/side effects and non-pharmacologic comfort measures Outcome: Adequate for Discharge   Problem: Health Behavior/Discharge Planning: Goal: Ability to manage health-related needs will improve Outcome: Adequate for Discharge   Problem: Nutrition: Goal: Adequate nutrition will be maintained Outcome: Adequate for Discharge

## 2020-02-10 NOTE — Progress Notes (Signed)
4 Days Post-Op Subjective: Pain controlled.  No nausea or emesis.  Passing flatus and having bowel movements.  Voiding clear yellow urine.  Ambulating.  Right flank bruising is stable.  Objective: Vital signs in last 24 hours: Temp:  [97.9 F (36.6 C)-98.7 F (37.1 C)] 98.7 F (37.1 C) (10/09 0448) Pulse Rate:  [86-105] 86 (10/09 0448) Resp:  [18] 18 (10/09 0448) BP: (117-132)/(70-82) 117/82 (10/09 0448) SpO2:  [89 %-97 %] 97 % (10/09 0448)  Intake/Output from previous day: 10/08 0701 - 10/09 0700 In: 240 [P.O.:237; I.V.:3] Out: 850 [Urine:850] Intake/Output this shift: No intake/output data recorded.  Physical Exam:  General: Alert and oriented CV: RRR Lungs: Clear Abdomen: Soft, ND, ATTP; inc c/d/I; stable ecchymosis along right flank and Gibson incision Ext: NT, No erythema  Lab Results: Recent Labs    02/09/20 0623 02/09/20 1158 02/10/20 0451  HGB 7.8* 7.9* 8.1*  HCT 23.9* 24.4* 25.0*   BMET Recent Labs    02/09/20 0623 02/10/20 0451  NA 137 138  K 4.4 4.3  CL 104 101  CO2 27 27  GLUCOSE 108* 104*  BUN 17 17  CREATININE 1.70* 1.56*  CALCIUM 8.7* 9.3     Studies/Results: No results found.  Assessment/Plan: 1. Right renal mass s/p right robotic assisted laparoscopic nephrectomy 02/06/2020 -Pain control prn -Regular diet as tolerated -Adequate UOP -H/Hstable at 8.1 -OOB, amb, IS, SQH -D/c home   LOS: 4 days   [name] 02/10/2020, 9:25 AM [facility]  Pager: [phone]

## 2020-02-10 NOTE — Discharge Summary (Signed)
Date of admission: 02/06/2020  Date of discharge: 02/10/2020  Admission diagnosis: Right renal mass  Discharge diagnosis: Right renal mass  History and Physical: For full details, please see admission history and physical. Briefly, Mitchell Ward is a 63 y.o. year old patient with right renal mass measuring 3.5 x 3.3 x 3.3 on the right posterior interpolar region with a nephrometry score of 7P.  He presented for a robotic assisted laparoscopic partial versus radical nephrectomy.  Hospital Course: Mitchell Ward was taken to the operating room on 10/5 for a robotic assisted laparoscopic partial versus radical nephrectomy.  As his tumor was quite deep with Intra-Op hemorrhage with inability to control bleeding with herniorrhaphy, we elected to perform a robotic assisted laparoscopic right radical nephrectomy adrenal sparing.  He tolerated this procedure well.  His diet was slowly advanced.  He had bowel movements.  He was tolerating his diet.  His hemoglobin remained stable.  On the day of discharge his hemoglobin was stable at 8.1.  His pain was controlled.  He was having bowel movements.  He is appropriate to discharge home with follow-up in the office as scheduled in 2 weeks.  Laboratory values: Recent Labs    02/09/20 0623 02/09/20 1158 02/10/20 0451  HGB 7.8* 7.9* 8.1*  HCT 23.9* 24.4* 25.0*   Recent Labs    02/09/20 0623 02/10/20 0451  CREATININE 1.70* 1.56*    Disposition: Home  Discharge instruction: The patient was instructed to be ambulatory but told to refrain from heavy lifting, strenuous activity, or driving.   Discharge medications:  Allergies as of 02/10/2020   No Known Allergies     Medication List    TAKE these medications   aspirin EC 81 MG tablet Take 81 mg by mouth daily. Swallow whole.   docusate sodium 100 MG capsule Commonly known as: Colace Take 1 capsule (100 mg total) by mouth daily as needed for up to 30 doses.   famotidine 40 MG tablet Commonly known  as: PEPCID Take 40 mg by mouth 2 (two) times daily.   fluticasone 50 MCG/ACT nasal spray Commonly known as: FLONASE Place 1 spray into both nostrils daily as needed for allergies or rhinitis.   ibuprofen 200 MG tablet Commonly known as: ADVIL Take 200-400 mg by mouth every 8 (eight) hours as needed for headache or moderate pain.   multivitamin with minerals Tabs tablet Take 1 tablet by mouth daily.   oxyCODONE-acetaminophen 5-325 MG tablet Commonly known as: Percocet Take 1 tablet by mouth every 4 (four) hours as needed for up to 18 doses for severe pain.   pantoprazole 40 MG tablet Commonly known as: PROTONIX Take 40 mg by mouth daily.   pravastatin 20 MG tablet Commonly known as: PRAVACHOL Take 20 mg by mouth daily.   telmisartan 40 MG tablet Commonly known as: MICARDIS Take 40 mg by mouth daily.   VITAMIN C PO Take 1 tablet by mouth daily.   VITAMIN E PO Take 1 capsule by mouth daily.       Followup:   Follow-up Information    [facility]. Call in 2 weeks.   Contact information: [address] [place]. [facility]  Pager: [phone]

## 2020-02-19 NOTE — Anesthesia Postprocedure Evaluation (Signed)
Anesthesia Post Note  Patient: Mitchell Ward  Procedure(s) Performed: XI ROBOTIC ASSITED LAPAROSCOPIC   NEPHRECTOMY (Right Abdomen) INTRA-OPERATIVE ULTRASOUND (Right Abdomen)     Patient location during evaluation: PACU Anesthesia Type: General Level of consciousness: awake and alert Pain management: pain level controlled Vital Signs Assessment: post-procedure vital signs reviewed and stable Respiratory status: spontaneous breathing, nonlabored ventilation, respiratory function stable and patient connected to nasal cannula oxygen Cardiovascular status: blood pressure returned to baseline and stable Postop Assessment: no apparent nausea or vomiting Anesthetic complications: no   No complications documented.  Last Vitals:  Vitals:   02/09/20 2148 02/10/20 0448  BP: 132/70 117/82  Pulse: (!) 105 86  Resp: 18 18  Temp: 36.6 C 37.1 C  SpO2: (!) 89% 97%    Last Pain:  Vitals:   02/10/20 1035  TempSrc:   PainSc: 0-No pain                 Glen Blatchley S

## 2020-03-12 ENCOUNTER — Encounter (HOSPITAL_COMMUNITY): Payer: Self-pay | Admitting: Urology

## 2020-04-30 ENCOUNTER — Encounter: Payer: Self-pay | Admitting: Gastroenterology

## 2020-05-01 ENCOUNTER — Encounter: Payer: Self-pay | Admitting: Gastroenterology

## 2020-05-27 ENCOUNTER — Encounter: Payer: Self-pay | Admitting: Gastroenterology

## 2020-05-27 ENCOUNTER — Ambulatory Visit (INDEPENDENT_AMBULATORY_CARE_PROVIDER_SITE_OTHER): Payer: Medicare Other | Admitting: Gastroenterology

## 2020-05-27 VITALS — BP 138/76 | HR 70 | Ht 64.0 in | Wt 138.4 lb

## 2020-05-27 DIAGNOSIS — Z1211 Encounter for screening for malignant neoplasm of colon: Secondary | ICD-10-CM

## 2020-05-27 DIAGNOSIS — K219 Gastro-esophageal reflux disease without esophagitis: Secondary | ICD-10-CM

## 2020-05-27 DIAGNOSIS — R1011 Right upper quadrant pain: Secondary | ICD-10-CM

## 2020-05-27 NOTE — Patient Instructions (Signed)
If you are age 66 or older, your body mass index should be between 23-30. Your Body mass index is 23.75 kg/m. If this is out of the aforementioned range listed, please consider follow up with your Primary Care Provider.  If you are age 75 or younger, your body mass index should be between 19-25. Your Body mass index is 23.75 kg/m. If this is out of the aformentioned range listed, please consider follow up with your Primary Care Provider.   You have been scheduled for an endoscopy and colonoscopy. Please follow the written instructions given to you at your visit today. Please pick up your prep supplies at the pharmacy within the next 1-3 days. If you use inhalers (even only as needed), please bring them with you on the day of your procedure.  Thank you,  Dr. Jackquline Denmark

## 2020-05-27 NOTE — Progress Notes (Signed)
Chief Complaint:   Referring Provider:  Elenore Paddy, NP      ASSESSMENT AND PLAN;   #1. RUQ pain. Neg [place], CT for etiology  #2. Colorectal cancer screening  #3. GERD  #4. 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   Plan: -Proceed with EGD/colon with miralax (Feb, any friday , not 11th) Discussed risks & benefits. (Risks including rare perforation req laparotomy, bleeding after bx/polypectomy req blood transfusion, rarely missing neoplasms, risks of anesthesia/sedation). Benefits outweigh the risks. Patient agrees to proceed. All the questions were answered. Consent forms given for review. -Continue protonix 40mg  po qd #30 -Stop famotidine    HPI:    Mitchell Ward is a 65 y.o. male  With HTN, HLD, S/p R nephrectomy Oct 2021 for renal Ca, CKD (has appt with nephrology), GERD  RUQ pain x 1 year, intermittent lasting 3 sec or so.  He underwent ultrasound of the abdomen followed by CT which was negative for any etiology.  It showed incidental right renal mass for which he underwent right nephrectomy with neg margins.  Sent to GI clinic for further evaluation.   Longstanding history of reflux.  He has been on ranitidine x 20 years with good relief until it was taken off the market.  Lately he has been having more waterbrash and regurgitation.  No odynophagia or dysphagia.  Was started on Protonix and famotidine with relief. He has been advised further GI work-up.   Also found to be anemic which has been attributed to kidney problems.  No melena or hematochezia.   He has cut down on orange juice in the morning which has helped with reflux symptoms.   Denies having any diarrhea or constipation.    Past GI procedures: -Screening colonoscopy at age 35- colon Dr Melina Copa- neg. -CT AP with contrast 1. 3.5 x 3.3 x 3.3 cm enhancing neoplasm in the interpolar region of  the right kidney compatible with a solid renal neoplasm.  Urologic consultation is  recommended.  2. Severe colonic diverticulosis without evidence of acute  diverticulitis at this time.  3. Aortic atherosclerosis.  4. Additional incidental findings, as above.   SH-daughter-in-law is a radiology tech who used to work in [facility].  Now works in [employer]. Past Medical History:  Diagnosis Date  . Cancer ([place])    Kidney  . Elevated cholesterol   . GERD (gastroesophageal reflux disease)   . Hypertension     Past Surgical History:  Procedure Laterality Date  . 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 COLONOSCOPY  2012   [place] Dr Orlena Sheldon 10 year repeat   . HERNIA REPAIR Left 2008  . OPERATIVE ULTRASOUND Right 02/06/2020   Procedure: INTRA-OPERATIVE ULTRASOUND;  Surgeon: [name], MD;  Location: [facility];  Service: Urology;  Laterality: Right;  . ROBOTIC ASSITED PARTIAL NEPHRECTOMY Right 02/06/2020   Procedure: XI ROBOTIC ASSITED LAPAROSCOPIC   NEPHRECTOMY;  Surgeon: [name], MD;  Location: [facility];  Service: Urology;  Laterality: Right;    Family History  Problem Relation Age of Onset  . Prostate cancer Maternal Uncle   . Prostate cancer Maternal Uncle   . Colon cancer Neg Hx   . Esophageal cancer Neg Hx     Social History   Tobacco Use  . Smoking status: Never Smoker  . Smokeless tobacco: Never Used  Vaping Use  . Vaping Use: Never used  Substance Use Topics  .  Alcohol use: Never  . Drug use: Never    Current Outpatient Medications  Medication Sig Dispense Refill  . amLODipine (NORVASC) 10 MG tablet Take 10 mg by mouth daily.    . Ascorbic Acid (VITAMIN C PO) Take 1 tablet by mouth daily.    Marland Kitchen aspirin EC 81 MG tablet Take 81 mg by mouth daily. Swallow whole.    . famotidine (PEPCID) 40 MG tablet Take 40 mg by mouth 2 (two) times daily.    . Multiple Vitamin (MULTIVITAMIN WITH MINERALS) TABS tablet Take 1 tablet by mouth daily.    . Omega-3 Fatty Acids (FISH OIL) 1200 MG CAPS Take 1,200 mg by mouth daily.    . pantoprazole  (PROTONIX) 40 MG tablet Take 40 mg by mouth daily.    . pravastatin (PRAVACHOL) 20 MG tablet Take 20 mg by mouth daily.     No current facility-administered medications for this visit.    No Known Allergies  Review of Systems:  Constitutional: Denies fever, chills, diaphoresis, appetite change and fatigue.  HEENT: Denies photophobia, eye pain, redness, hearing loss, ear pain, congestion, sore throat, rhinorrhea, sneezing, mouth sores, neck pain, neck stiffness and tinnitus.   Respiratory: Denies SOB, DOE, cough, chest tightness,  and wheezing.   Cardiovascular: Denies chest pain, palpitations and leg swelling.  Genitourinary: Denies dysuria, urgency, frequency, hematuria, flank pain and difficulty urinating.  Musculoskeletal: Denies myalgias, back pain, joint swelling, arthralgias and gait problem.  Skin: No rash.  Neurological: Denies dizziness, seizures, syncope, weakness, light-headedness, numbness and headaches.  Hematological: Denies adenopathy. Easy bruising, personal or family bleeding history  Psychiatric/Behavioral: No anxiety or depression     Physical Exam:    BP 138/76   Pulse 70   Ht 5\' 4"  (1.626 m)   Wt 138 lb 6 oz (62.8 kg)   BMI 23.75 kg/m  Wt Readings from Last 3 Encounters:  05/27/20 138 lb 6 oz (62.8 kg)  02/06/20 140 lb (63.5 kg)  01/30/20 140 lb (63.5 kg)   Constitutional:  Well-developed, in no acute distress. Psychiatric: Normal mood and affect. Behavior is normal. HEENT: Pupils normal.  Conjunctivae are normal. No scleral icterus. Neck supple.  Cardiovascular: Normal rate, regular rhythm. No edema Pulmonary/chest: Effort normal and breath sounds normal. No wheezing, rales or rhonchi. Abdominal: Soft, nondistended. Nontender. Bowel sounds active throughout. There are no masses palpable. No hepatomegaly.  Well-healed surgical scars. Rectal: Deferred Neurological: Alert and oriented to person place and time. Skin: Skin is warm and dry. No rashes  noted.  Data Reviewed: I have personally reviewed following labs and imaging studies  CBC: CBC Latest Ref Rng & Units 02/10/2020 02/09/2020 02/09/2020  WBC 4.0 - 10.5 K/uL 10.9(H) 14.3(H) 13.8(H)  Hemoglobin 13.0 - 17.0 g/dL 8.1(L) 7.9(L) 7.8(L)  Hematocrit 39.0 - 52.0 % 25.0(L) 24.4(L) 23.9(L)  Platelets 150 - 400 K/uL 226 192 199    CMP: CMP Latest Ref Rng & Units 02/10/2020 02/09/2020 02/08/2020  Glucose 70 - 99 mg/dL 104(H) 108(H) 125(H)  BUN 8 - 23 mg/dL 17 17 18   Creatinine 0.61 - 1.24 mg/dL 1.56(H) 1.70(H) 1.69(H)  Sodium 135 - 145 mmol/L 138 137 140  Potassium 3.5 - 5.1 mmol/L 4.3 4.4 4.5  Chloride 98 - 111 mmol/L 101 104 106  CO2 22 - 32 mmol/L 27 27 25   Calcium 8.9 - 10.3 mg/dL 9.3 8.7(L) 8.9     Carmell Austria, MD 05/27/2020, 3:22 PM  Cc: Elenore Paddy, NP

## 2020-05-28 ENCOUNTER — Other Ambulatory Visit (HOSPITAL_COMMUNITY): Payer: Self-pay | Admitting: Urology

## 2020-05-28 ENCOUNTER — Other Ambulatory Visit: Payer: Self-pay | Admitting: Urology

## 2020-05-28 DIAGNOSIS — D49511 Neoplasm of unspecified behavior of right kidney: Secondary | ICD-10-CM

## 2020-06-06 ENCOUNTER — Ambulatory Visit (HOSPITAL_COMMUNITY): Payer: PRIVATE HEALTH INSURANCE

## 2020-06-14 ENCOUNTER — Ambulatory Visit (HOSPITAL_COMMUNITY): Payer: PRIVATE HEALTH INSURANCE

## 2020-07-01 ENCOUNTER — Ambulatory Visit (AMBULATORY_SURGERY_CENTER): Payer: Medicare Other | Admitting: Gastroenterology

## 2020-07-01 ENCOUNTER — Encounter: Payer: Self-pay | Admitting: Gastroenterology

## 2020-07-01 ENCOUNTER — Other Ambulatory Visit: Payer: Self-pay

## 2020-07-01 VITALS — BP 105/62 | HR 56 | Temp 96.3°F | Resp 13 | Ht 64.0 in | Wt 138.0 lb

## 2020-07-01 DIAGNOSIS — D123 Benign neoplasm of transverse colon: Secondary | ICD-10-CM

## 2020-07-01 DIAGNOSIS — K2970 Gastritis, unspecified, without bleeding: Secondary | ICD-10-CM | POA: Diagnosis not present

## 2020-07-01 DIAGNOSIS — Z1211 Encounter for screening for malignant neoplasm of colon: Secondary | ICD-10-CM | POA: Diagnosis not present

## 2020-07-01 DIAGNOSIS — R1011 Right upper quadrant pain: Secondary | ICD-10-CM

## 2020-07-01 DIAGNOSIS — K635 Polyp of colon: Secondary | ICD-10-CM

## 2020-07-01 DIAGNOSIS — K2950 Unspecified chronic gastritis without bleeding: Secondary | ICD-10-CM

## 2020-07-01 DIAGNOSIS — K449 Diaphragmatic hernia without obstruction or gangrene: Secondary | ICD-10-CM

## 2020-07-01 MED ORDER — SODIUM CHLORIDE 0.9 % IV SOLN: 500.0000 mL | Freq: Once | INTRAVENOUS | Status: DC

## 2020-07-01 NOTE — Op Note (Signed)
[facility] Patient Name: Mitchell Ward Procedure Date: 07/01/2020 2:22 PM MRN: [id number] Endoscopist: Jackquline Denmark , MD Age: 65 Referring MD:  Date of Birth: 08/29/1955 Gender: Male Account #: [id number] Procedure:                Colonoscopy Indications:              Screening for colorectal malignant neoplasm Medicines:                Monitored Anesthesia Care Procedure:                Pre-Anesthesia Assessment:                           - Prior to the procedure, a History and Physical                            was performed, and patient medications and                            allergies were reviewed. The patient's tolerance of                            previous anesthesia was also reviewed. The risks                            and benefits of the procedure and the sedation                            options and risks were discussed with the patient.                            All questions were answered, and informed consent                            was obtained. Prior Anticoagulants: The patient has                            taken no previous anticoagulant or antiplatelet                            agents. ASA Grade Assessment: II - A patient with                            mild systemic disease. After reviewing the risks                            and benefits, the patient was deemed in                            satisfactory condition to undergo the procedure.                           After obtaining informed consent, the colonoscope  was passed under direct vision. Throughout the                            procedure, the patient's blood pressure, pulse, and                            oxygen saturations were monitored continuously. The                            Olympus PCF-H190DL (#[id number]) Colonoscope was                            introduced through the anus and advanced to the the                            cecum, identified by  appendiceal orifice and                            ileocecal valve. The colonoscopy was performed                            without difficulty. The patient tolerated the                            procedure well. The quality of the bowel                            preparation was good except in some areas of the                            sigmoid colon, where there was retained solid                            stool. The ileocecal valve, appendiceal orifice,                            and rectum were photographed. Scope In: 2:41:43 PM Scope Out: 3:00:01 PM Scope Withdrawal Time: 0 hours 10 minutes 45 seconds  Total Procedure Duration: 0 hours 18 minutes 18 seconds  Findings:                 A 6 mm polyp was found in the hepatic flexure. The                            polyp was sessile. The polyp was removed with a                            cold snare. Resection and retrieval were complete.                           Multiple medium-mouthed diverticula were found in                            the sigmoid colon, few in  descending colon and                            ascending colon. This would give sigmoid colon a                            "Swiss cheese appearance". There was some luminal                            narrowing in the sigmoid colon consistent with                            muscular hypertrophy.                           Non-bleeding internal hemorrhoids were found during                            retroflexion. The hemorrhoids were moderate.                           The exam was otherwise without abnormality on                            direct and retroflexion views. Complications:            No immediate complications. Estimated Blood Loss:     Estimated blood loss: none. Impression:               - One 6 mm polyp at the hepatic flexure, removed                            with a cold snare. Resected and retrieved.                           - Moderate to severe  predominantly sigmoid                            diverticulosis.                           - Non-bleeding internal hemorrhoids.                           - The examination was otherwise normal on direct                            and retroflexion views. Recommendation:           - Patient has a contact number available for                            emergencies. The signs and symptoms of potential                            delayed complications were discussed with the  patient. Return to normal activities tomorrow.                            Written discharge instructions were provided to the                            patient.                           - High fiber diet.                           - Continue present medications.                           - Await pathology results.                           - Repeat colonoscopy for surveillance based on                            pathology results.                           - The findings and recommendations were discussed                            with the patient's family. Jackquline Denmark, MD 07/01/2020 3:08:58 PM This report has been signed electronically.

## 2020-07-01 NOTE — Progress Notes (Signed)
pt tolerated well. VSS. awake and to recovery. Report given to RN. Bite block placed while awake by Tech. Pt without c/o. To recovery with bite block insitu. No trauma noted.

## 2020-07-01 NOTE — Patient Instructions (Signed)
YOU HAD AN ENDOSCOPIC PROCEDURE TODAY AT THE [facility]:   Refer to the procedure report that was given to you for any specific questions about what was found during the examination.  If the procedure report does not answer your questions, please call your gastroenterologist to clarify.  If you requested that your care partner not be given the details of your procedure findings, then the procedure report has been included in a sealed envelope for you to review at your convenience later.  YOU SHOULD EXPECT: Some feelings of bloating in the abdomen. Passage of more gas than usual.  Walking can help get rid of the air that was put into your GI tract during the procedure and reduce the bloating. If you had a lower endoscopy (such as a colonoscopy or flexible sigmoidoscopy) you may notice spotting of blood in your stool or on the toilet paper. If you underwent a bowel prep for your procedure, you may not have a normal bowel movement for a few days.  Please Note:  You might notice some irritation and congestion in your nose or some drainage.  This is from the oxygen used during your procedure.  There is no need for concern and it should clear up in a day or so.  SYMPTOMS TO REPORT IMMEDIATELY:   Following lower endoscopy (colonoscopy or flexible sigmoidoscopy):  Excessive amounts of blood in the stool  Significant tenderness or worsening of abdominal pains  Swelling of the abdomen that is new, acute  Fever of 100F or higher   Following upper endoscopy (EGD)  Vomiting of blood or coffee ground material  New chest pain or pain under the shoulder blades  Painful or persistently difficult swallowing  New shortness of breath  Fever of 100F or higher  Black, tarry-looking stools  For urgent or emergent issues, a gastroenterologist can be reached at any hour by calling [phone]. Do not use MyChart messaging for urgent concerns.    DIET:  We do recommend a small meal at first, but  then you may proceed to your regular diet.  Drink plenty of fluids but you should avoid alcoholic beverages for 24 hours.  ACTIVITY:  You should plan to take it easy for the rest of today and you should NOT DRIVE or use heavy machinery until tomorrow (because of the sedation medicines used during the test).    FOLLOW UP: Our staff will call the number listed on your records 48-72 hours following your procedure to check on you and address any questions or concerns that you may have regarding the information given to you following your procedure. If we do not reach you, we will leave a message.  We will attempt to reach you two times.  During this call, we will ask if you have developed any symptoms of COVID 19. If you develop any symptoms (ie: fever, flu-like symptoms, shortness of breath, cough etc.) before then, please call [phone].  If you test positive for Covid 19 in the 2 weeks post procedure, please call and report this information to us.    If any biopsies were taken you will be contacted by phone or by letter within the next 1-3 weeks.  Please call us at [phone] if you have not heard about the biopsies in 3 weeks.    SIGNATURES/CONFIDENTIALITY: You and/or your care partner have signed paperwork which will be entered into your electronic medical record.  These signatures attest to the fact that that the information above on   your After Visit Summary has been reviewed and is understood.  Full responsibility of the confidentiality of this discharge information lies with you and/or your care-partner. 

## 2020-07-01 NOTE — Progress Notes (Signed)
[facility] 

## 2020-07-01 NOTE — Progress Notes (Signed)
Called to room to assist during endoscopic procedure.  Patient ID and intended procedure confirmed with present staff. Received instructions for my participation in the procedure from the performing physician.  

## 2020-07-01 NOTE — Op Note (Signed)
[facility] Patient Name: Mitchell Ward Procedure Date: 07/01/2020 2:22 PM MRN: [id number] Endoscopist: Jackquline Denmark , MD Age: 65 Referring MD:  Date of Birth: 1955/05/31 Gender: Male Account #: [id number] Procedure:                Upper GI endoscopy Indications:              Abdominal pain in the right upper quadrant Medicines:                Monitored Anesthesia Care Procedure:                Pre-Anesthesia Assessment:                           - Prior to the procedure, a History and Physical                            was performed, and patient medications and                            allergies were reviewed. The patient's tolerance of                            previous anesthesia was also reviewed. The risks                            and benefits of the procedure and the sedation                            options and risks were discussed with the patient.                            All questions were answered, and informed consent                            was obtained. Prior Anticoagulants: The patient has                            taken no previous anticoagulant or antiplatelet                            agents. ASA Grade Assessment: II - A patient with                            mild systemic disease. After reviewing the risks                            and benefits, the patient was deemed in                            satisfactory condition to undergo the procedure.                           After obtaining informed consent, the endoscope was  passed under direct vision. Throughout the                            procedure, the patient's blood pressure, pulse, and                            oxygen saturations were monitored continuously. The                            Endoscope was introduced through the mouth, and                            advanced to the second part of duodenum. The upper                            GI endoscopy was  accomplished without difficulty.                            The patient tolerated the procedure well. Scope In: Scope Out: Findings:                 The examined esophagus was normal with well-defined                            Z-line at 35 cm, examined by NBI.                           A small hiatal hernia was present. The gastric                            mucosa otherwise was normal. Multiple biopsies were                            from antrum and body of the stomach to r/o HP.                           The examined duodenum was normal. Biopsies for                            histology were taken with a cold forceps for                            evaluation of celiac disease. Complications:            No immediate complications. Estimated Blood Loss:     Estimated blood loss: none. Impression:               - Small hiatal hernia.                           - Otherwise normal EGD. Recommendation:           - Patient has a contact number available for                            emergencies. The signs  and symptoms of potential                            delayed complications were discussed with the                            patient. Return to normal activities tomorrow.                            Written discharge instructions were provided to the                            patient.                           - Resume previous diet.                           - Continue present medications.                           - Await pathology results.                           - Continue present medications.                           - The findings and recommendations were discussed                            with the patient's family. Jackquline Denmark, MD 07/01/2020 3:04:39 PM This report has been signed electronically.

## 2020-07-03 ENCOUNTER — Telehealth: Payer: Self-pay | Admitting: *Deleted

## 2020-07-03 ENCOUNTER — Ambulatory Visit (HOSPITAL_COMMUNITY): Payer: PRIVATE HEALTH INSURANCE

## 2020-07-03 ENCOUNTER — Encounter (HOSPITAL_COMMUNITY): Payer: Self-pay

## 2020-07-03 NOTE — Telephone Encounter (Signed)
  Follow up Call-  Call back number 07/01/2020  Post procedure Call Back phone  # [phone]  Permission to leave phone message Yes  Some recent data might be hidden     Patient questions:  Do you have a fever, pain , or abdominal swelling? No. Pain Score  0 *  Have you tolerated food without any problems? Yes.    Have you been able to return to your normal activities? Yes.    Do you have any questions about your discharge instructions: Diet   No. Medications  No. Follow up visit  No.  Do you have questions or concerns about your Care? No.  Actions: * If pain score is 4 or above: No action needed, pain <4.

## 2020-07-24 ENCOUNTER — Encounter: Payer: Self-pay | Admitting: Gastroenterology

## 2020-08-02 ENCOUNTER — Ambulatory Visit (HOSPITAL_COMMUNITY)
Admission: RE | Admit: 2020-08-02 | Discharge: 2020-08-02 | Disposition: A | Discharge status: Home / Self Care | Payer: PRIVATE HEALTH INSURANCE | Source: Ambulatory Visit | Attending: Urology | Admitting: Urology

## 2020-08-02 ENCOUNTER — Other Ambulatory Visit: Payer: Self-pay

## 2020-08-02 DIAGNOSIS — D49511 Neoplasm of unspecified behavior of right kidney: Secondary | ICD-10-CM | POA: Insufficient documentation

## 2020-08-02 IMAGING — MR MR ABDOMEN WO/W CM
18 series · 48 of 48 positions shown · IV contrast (gadavist)
Comparison: CT abdomen/pelvis dated [DATE]

CLINICAL DATA: Renal mass, prior nephrectomy

EXAM:
MRI ABDOMEN WITHOUT AND WITH CONTRAST
TECHNIQUE: Multiplanar multisequence MR imaging of the abdomen was performed
both before and after the administration of intravenous contrast.
CONTRAST:  6mL GADAVIST GADOBUTROL 1 MMOL/ML IV SOLN

[Series 3: T2 · coronal · 6.0mm · 1.56mm/px · 2 of 34 slices shown (1 of 2)]
[im 1/34]
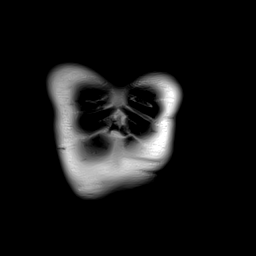
[im 34/34]
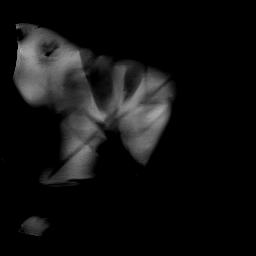

[Series 5: T2 fat-sat · axial · 6.0mm · 1.25mm/px · z∈[-129,+123]mm · 2 of 36 slices shown]
[im 1/36]
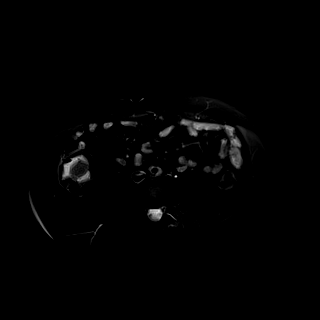
[im 36/36]
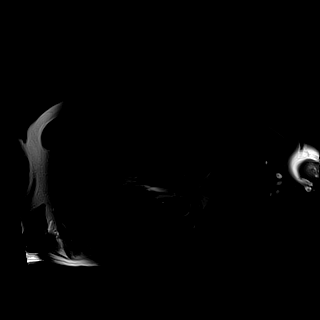

[Series 6: DWI · axial · 6.0mm · 1.49mm/px · z∈[-132,+120]mm · 3 of 72 slices shown (1 of 2)]
[im 1/72]
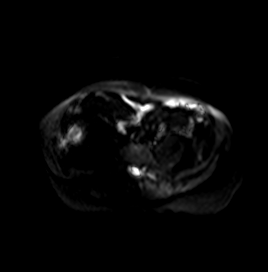
[im 36/72]
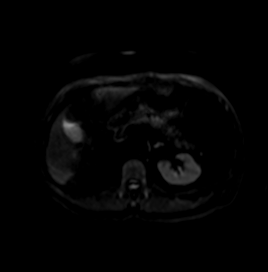
[im 72/72]
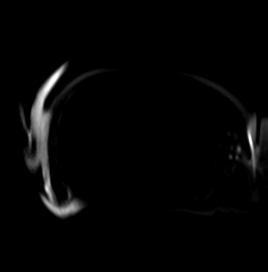

[Series 7: DWI · axial · 6.0mm · 1.49mm/px · 1 of 36 slices shown (2 of 2)]
[im 1/36]
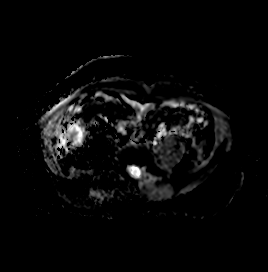

[Series 8: T1 · axial · 3.0mm · 1.25mm/px · z∈[-144,+92]mm · 3 of 80 slices shown (1 of 2)]
[im 1/80]
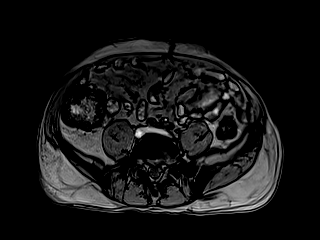
[im 40/80]
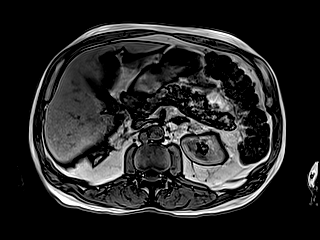
[im 80/80]
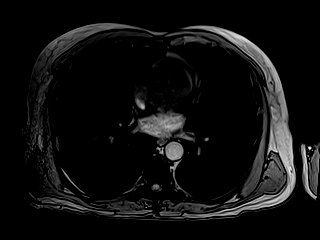

[Series 9: T1 · axial · 3.0mm · 1.25mm/px · z∈[-144,+92]mm · 3 of 80 slices shown (2 of 2)]
[im 1/80]
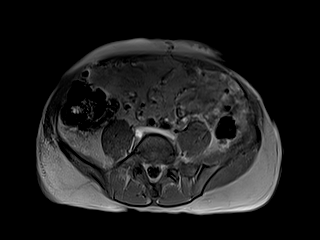
[im 40/80]
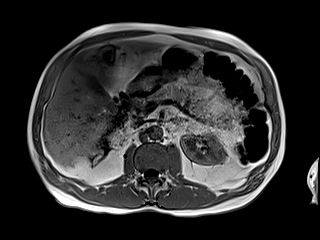
[im 80/80]
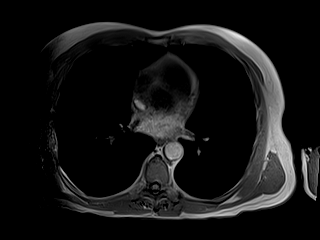

[Series 10: bSSFP · axial · 5.0mm · 0.84mm/px · z∈[-161,+108]mm · 2 of 50 slices shown]
[im 1/50]
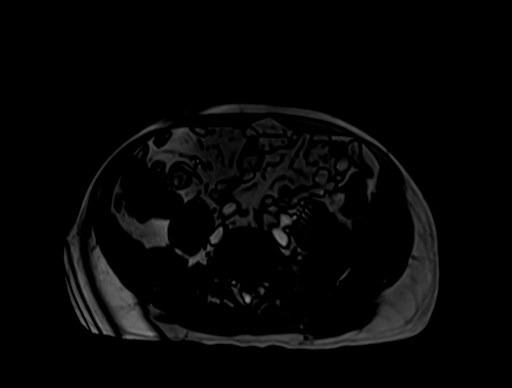
[im 50/50]
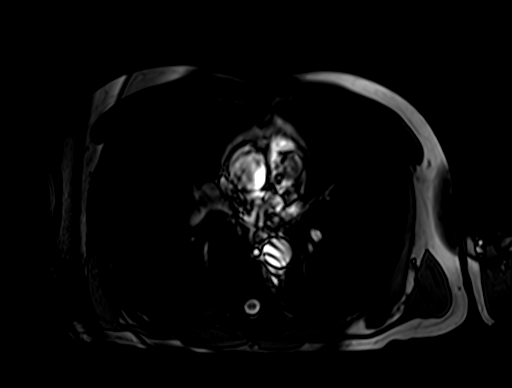

[Series 12: T1 dynamic · axial · 3.0mm · 1.25mm/px · z∈[-160,+77]mm · 3 of 80 slices shown (1 of 10)]
[im 1/80]
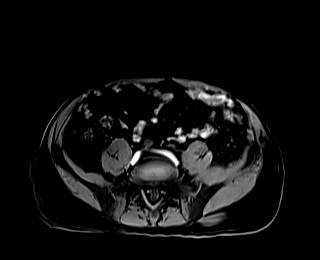
[im 40/80]
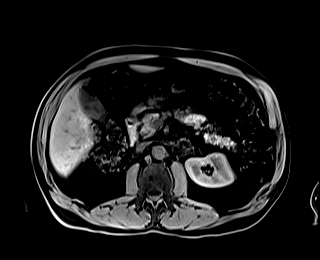
[im 80/80]
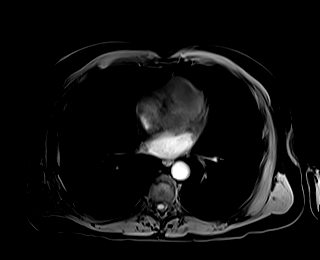

[Series 16: T1 dynamic · axial · 3.0mm · 1.25mm/px · z∈[-160,+77]mm · 3 of 80 slices shown (2 of 10)]
[im 1/80]
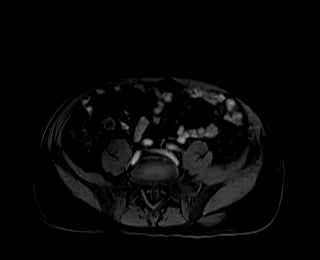
[im 40/80]
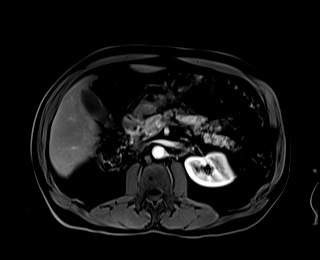
[im 80/80]
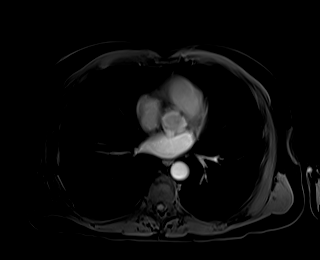

[Series 17: T1 dynamic · axial · 3.0mm · 1.25mm/px · z∈[-160,+77]mm · 3 of 80 slices shown (3 of 10)]
[im 1/80]
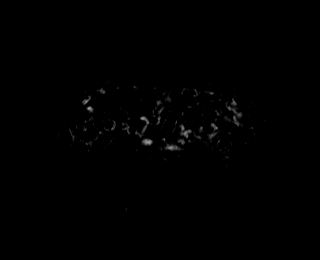
[im 40/80]
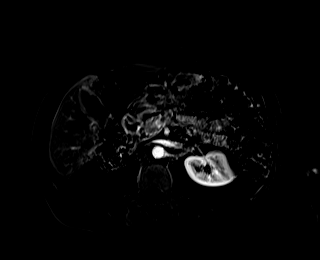
[im 80/80]
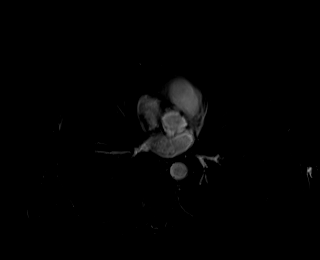

[Series 20: T1 dynamic · axial · 3.0mm · 1.25mm/px · z∈[-160,+77]mm · 3 of 80 slices shown (4 of 10)]
[im 1/80]
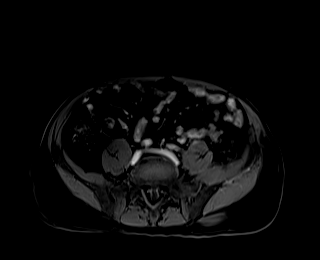
[im 40/80]
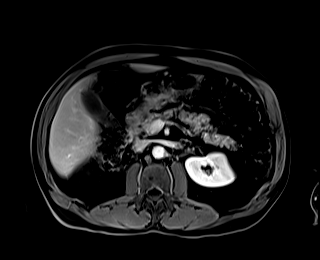
[im 80/80]
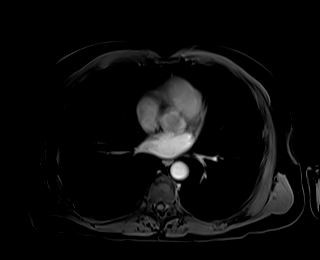

[Series 21: T1 dynamic · axial · 3.0mm · 1.25mm/px · z∈[-160,+77]mm · 3 of 80 slices shown (5 of 10)]
[im 1/80]
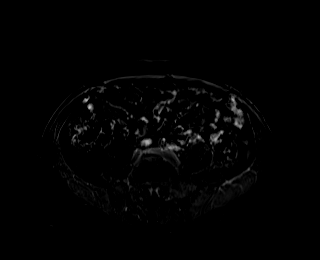
[im 40/80]
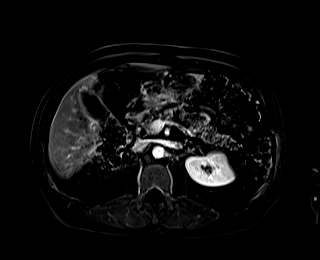
[im 80/80]
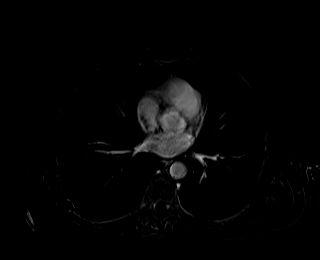

[Series 24: T1 dynamic · axial · 3.0mm · 1.25mm/px · z∈[-160,+77]mm · 3 of 80 slices shown (6 of 10)]
[im 1/80]
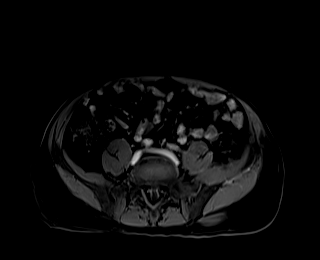
[im 40/80]
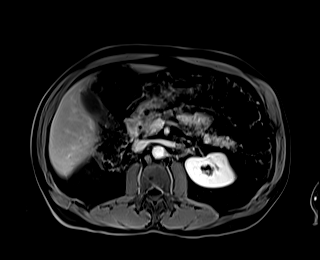
[im 80/80]
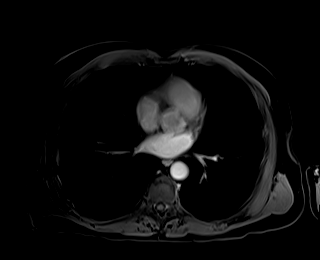

[Series 25: T1 dynamic · axial · 3.0mm · 1.25mm/px · z∈[-160,+77]mm · 3 of 80 slices shown (7 of 10)]
[im 1/80]
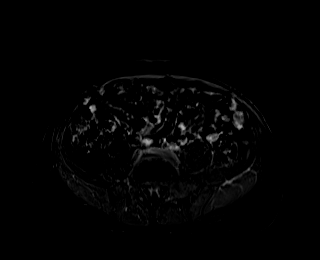
[im 40/80]
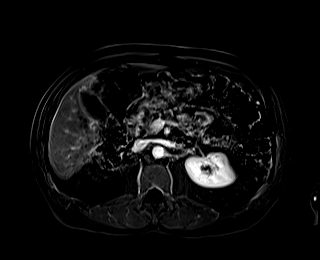
[im 80/80]
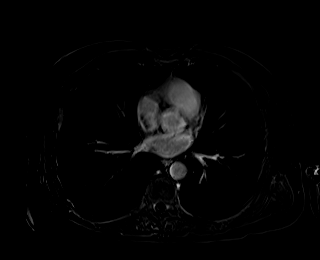

[Series 27: T1 dynamic · coronal · 4.0mm · 1.41mm/px · 3 of 64 slices shown (8 of 10)]
[im 1/64]
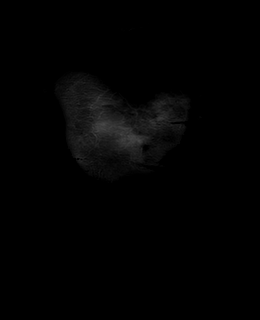
[im 32/64]
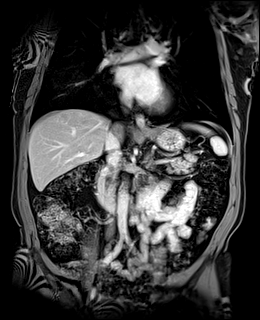
[im 64/64]
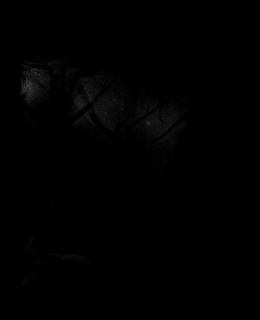

[Series 28: T2 · axial · 6.0mm · 1.56mm/px · z∈[-189,+91]mm · 2 of 40 slices shown (2 of 2)]
[im 1/40]
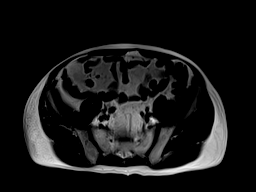
[im 40/40]
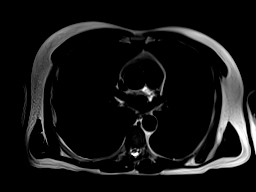

[Series 31: T1 dynamic · axial · 3.0mm · 1.25mm/px · z∈[-160,+77]mm · 3 of 80 slices shown (9 of 10)]
[im 1/80]
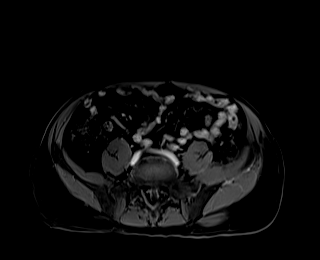
[im 40/80]
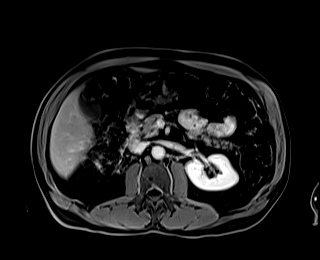
[im 80/80]
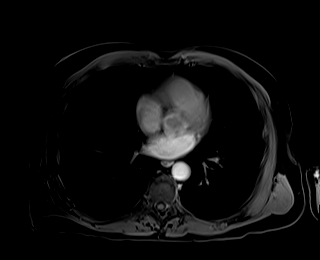

[Series 32: T1 dynamic · axial · 3.0mm · 1.25mm/px · z∈[-160,+77]mm · 3 of 80 slices shown (10 of 10)]
[im 1/80]
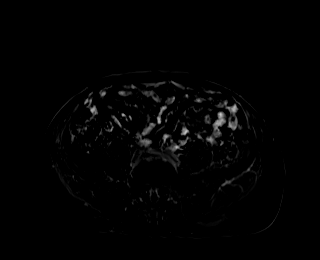
[im 40/80]
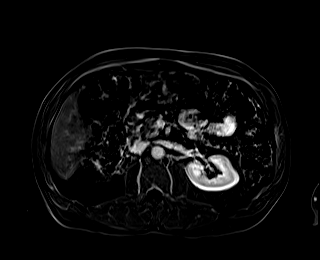
[im 80/80]
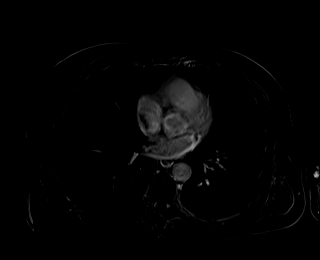

[48 of 48 positions shown; findings below may reference images not displayed]

FINDINGS: Lower chest: Lung bases are clear.

Hepatobiliary: Liver is within normal limits. No
suspicious/enhancing hepatic lesions.

Gallbladder is unremarkable. No intrahepatic or extrahepatic ductal
dilatation.

Pancreas:  Within normal limits.

Spleen:  Within normal limits.

Adrenals/Urinary Tract:  Adrenal glands are within normal limits.

Status post right nephrectomy. No abnormal soft tissue in the
surgical bed.

Left kidney is within normal limits.  No hydronephrosis.

Stomach/Bowel: Stomach is within normal limits.

Visualized bowel is unremarkable.

Vascular/Lymphatic:  No evidence of abdominal aortic aneurysm.

No suspicious abdominal lymphadenopathy.

Other:  No abdominal ascites.

Musculoskeletal: No focal osseous lesions.
IMPRESSION: Status post right nephrectomy.

No evidence of recurrent or metastatic disease.

## 2020-08-02 MED ORDER — GADOBUTROL 1 MMOL/ML IV SOLN
6.0000 mL | Freq: Once | INTRAVENOUS | Status: AC | PRN
  Administered 2020-08-02: 6 mL via INTRAVENOUS

## 2021-03-13 ENCOUNTER — Other Ambulatory Visit: Payer: Self-pay | Admitting: Urology

## 2021-03-13 DIAGNOSIS — D49511 Neoplasm of unspecified behavior of right kidney: Secondary | ICD-10-CM

## 2021-04-08 ENCOUNTER — Ambulatory Visit
Admission: RE | Admit: 2021-04-08 | Discharge: 2021-04-08 | Disposition: A | Discharge status: Home / Self Care | Payer: Medicare Other | Source: Ambulatory Visit | Attending: Urology | Admitting: Urology

## 2021-04-08 ENCOUNTER — Other Ambulatory Visit: Payer: Self-pay | Admitting: Urology

## 2021-04-08 ENCOUNTER — Other Ambulatory Visit: Payer: Self-pay

## 2021-04-08 DIAGNOSIS — D49511 Neoplasm of unspecified behavior of right kidney: Secondary | ICD-10-CM

## 2021-04-08 IMAGING — CR DG CHEST 2V
2 series · 2 of 2 positions shown · non-contrast
Comparison: No priors.

CLINICAL DATA: 65-year-old male with history of right renal
neoplasm status post right nephrectomy.

EXAM:
CHEST - 2 VIEW

[w chest pa]
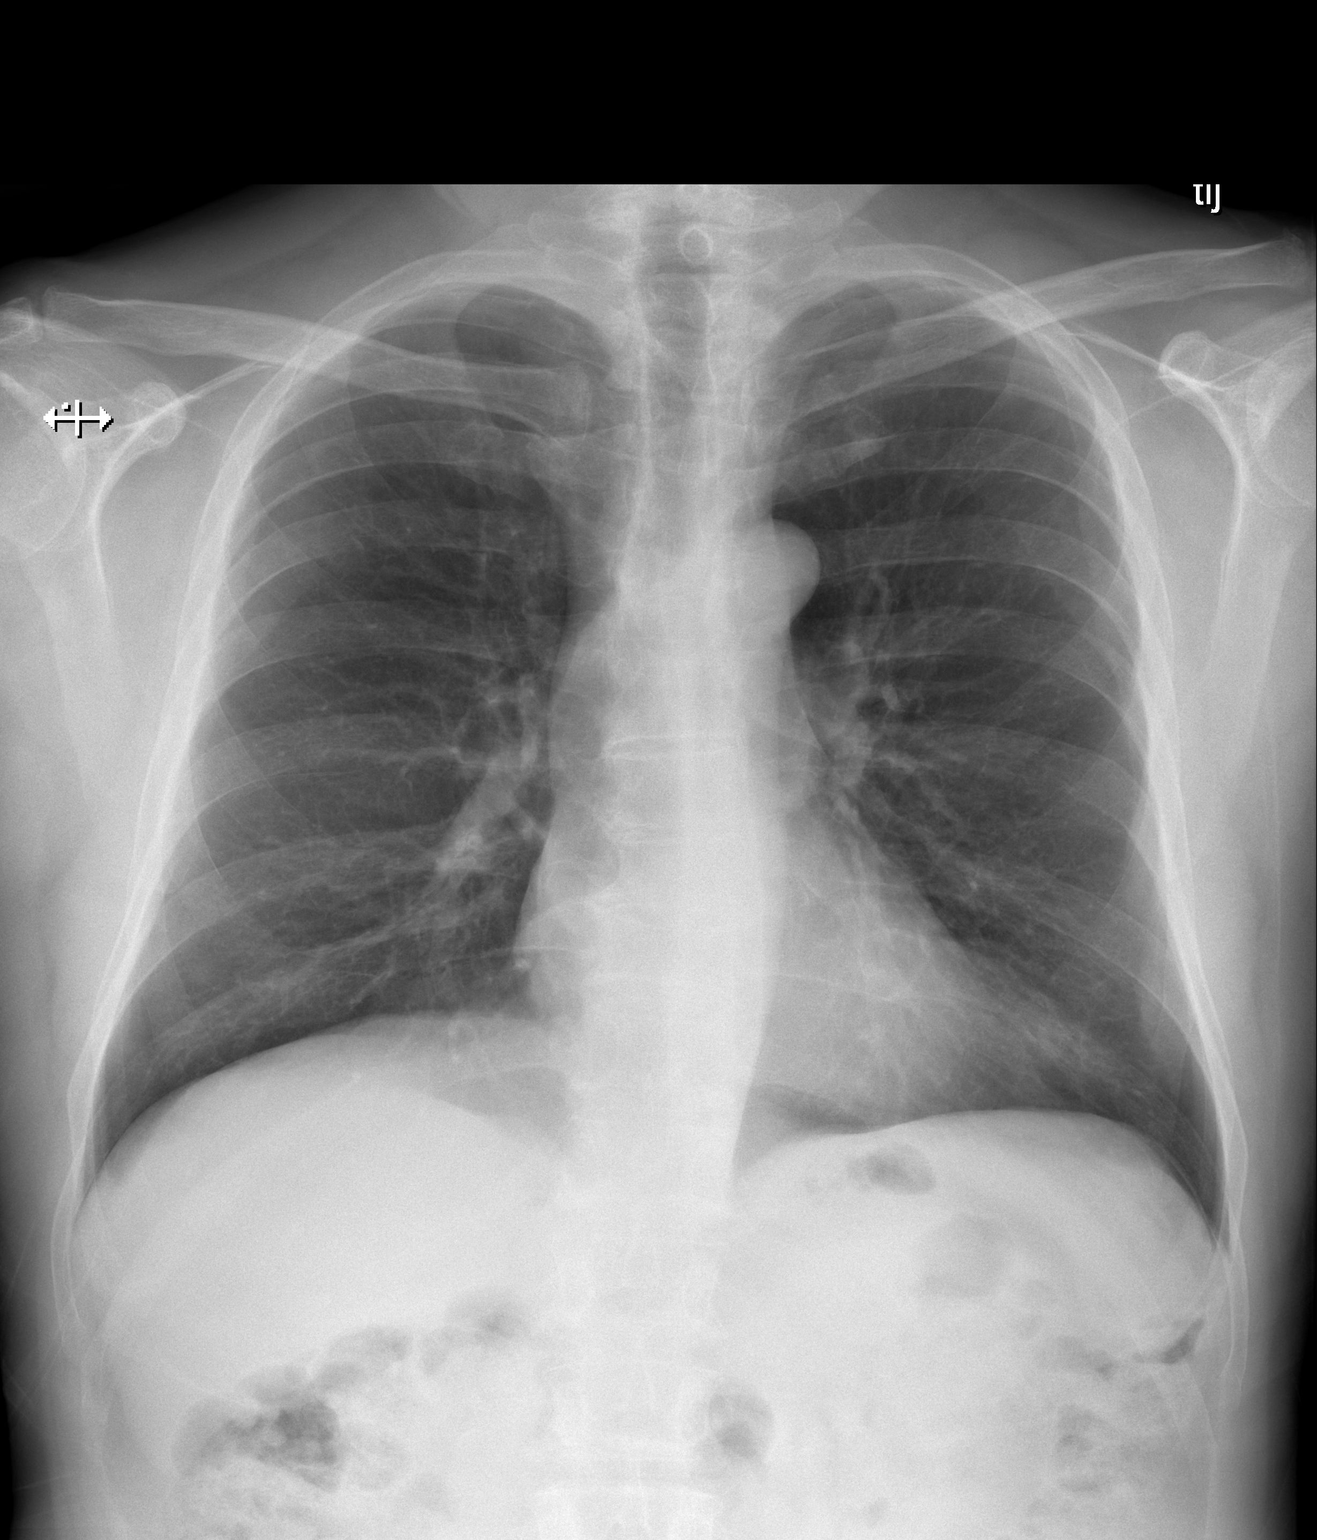

[w chest lat]
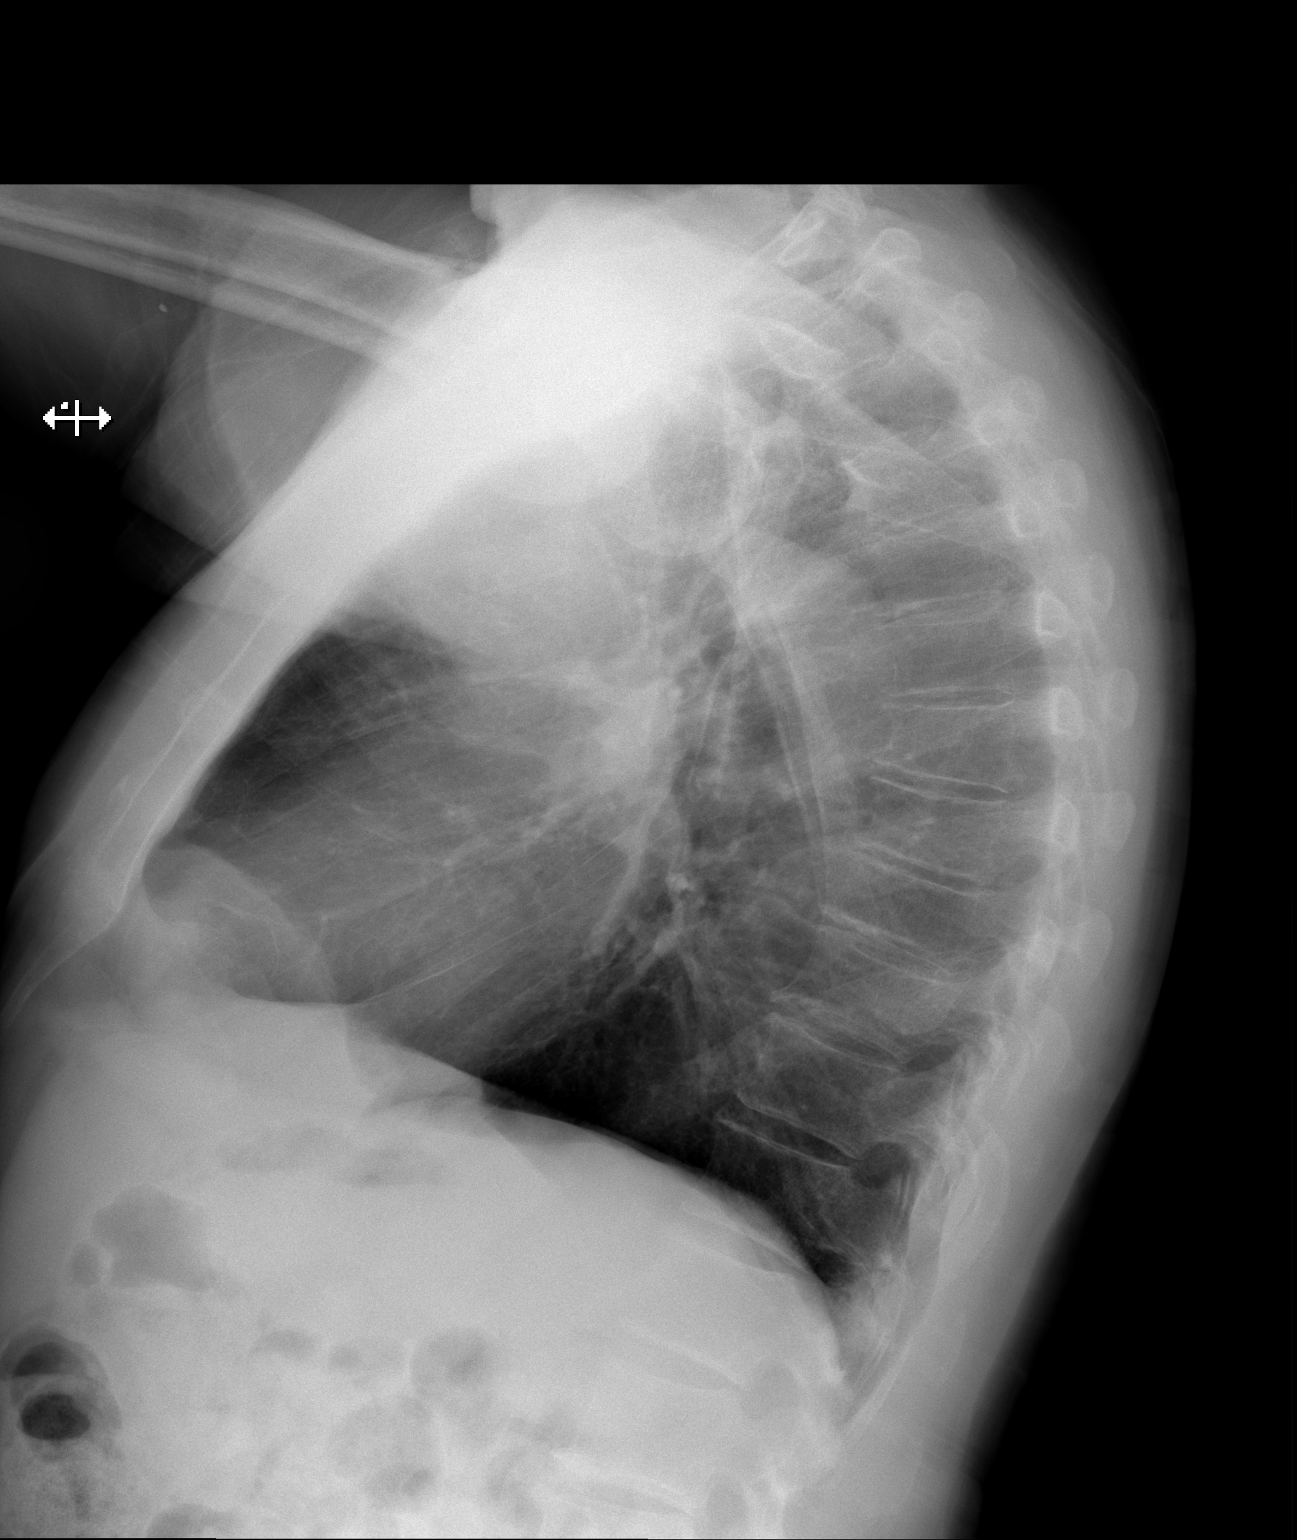

[2 of 2 positions shown; findings below may reference images not displayed]

FINDINGS: Lung volumes are normal. No consolidative airspace disease. No
pleural effusions. No pneumothorax. No pulmonary nodule or mass
noted. Pulmonary vasculature and the cardiomediastinal silhouette
are within normal limits.
IMPRESSION: No radiographic evidence of acute cardiopulmonary disease.

## 2021-04-08 IMAGING — MR MR ABDOMEN WO/W CM
17 series · 48 of 48 positions shown · IV contrast (13ML MULTI)
Comparison: Abdominal MRI [DATE].

CLINICAL DATA: 65-year-old male with history of renal cell
carcinoma status post right nephrectomy. Follow-up study.

EXAM:
MRI ABDOMEN WITHOUT AND WITH CONTRAST
TECHNIQUE: Multiplanar multisequence MR imaging of the abdomen was performed
both before and after the administration of intravenous contrast.
CONTRAST:  13mL MULTIHANCE GADOBENATE DIMEGLUMINE 529 MG/ML IV SOLN

[Series 3: T2 · coronal · 5.0mm · 1.56mm/px · 2 of 36 slices shown (1 of 3)]
[im 1/36]
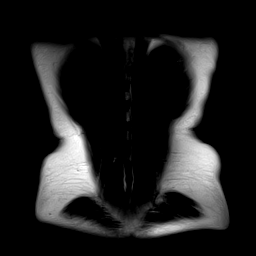
[im 36/36]
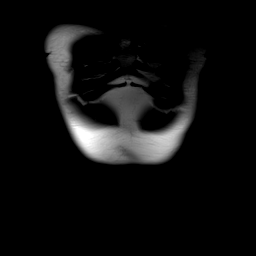

[Series 4: T1 · axial · 3.0mm · 1.19mm/px · z∈[-59,+118]mm · 5 of 120 slices shown]
[im 1/120]
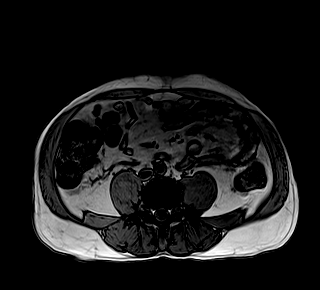
[im 30/120]
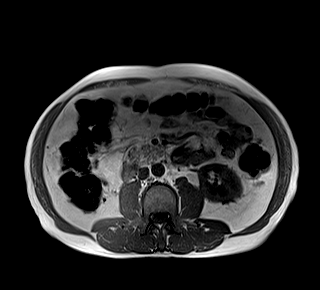
[im 60/120]
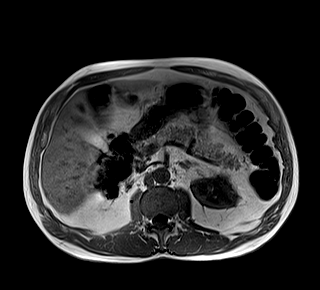
[im 90/120]
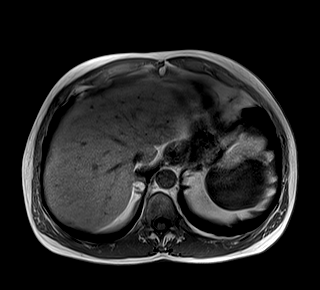
[im 120/120]
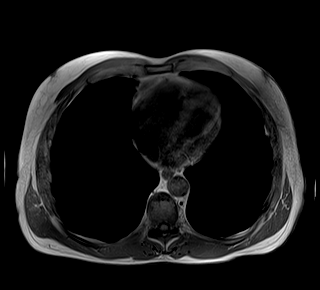

[Series 5: bSSFP · axial · 5.0mm · 1.25mm/px · 1 of 30 slices shown]
[im 1/30]
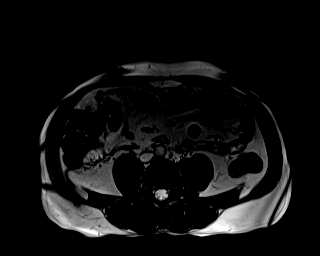

[Series 6: T2 · axial · 5.0mm · 1.48mm/px · z∈[-42,+180]mm · 2 of 38 slices shown (2 of 3)]
[im 1/38]
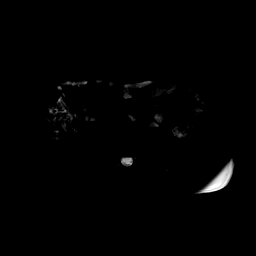
[im 38/38]
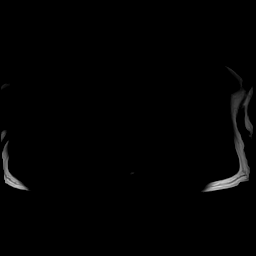

[Series 7: DWI · axial · 5.0mm · 1.42mm/px · z∈[-42,+180]mm · 5 of 114 slices shown (1 of 2)]
[im 1/114]
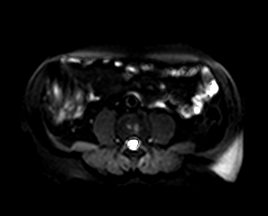
[im 29/114]
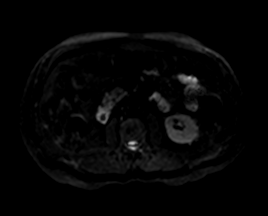
[im 57/114]
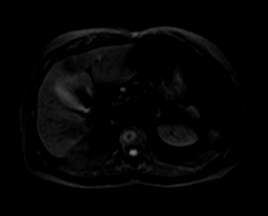
[im 85/114]
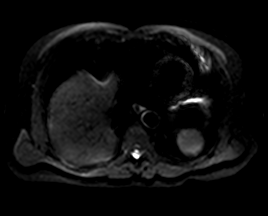
[im 114/114]
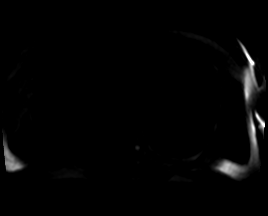

[Series 8: DWI · axial · 5.0mm · 1.42mm/px · z∈[-42,+180]mm · 2 of 38 slices shown (2 of 2)]
[im 1/38]
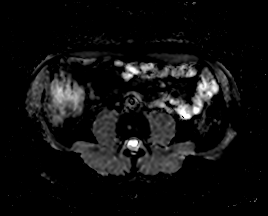
[im 38/38]
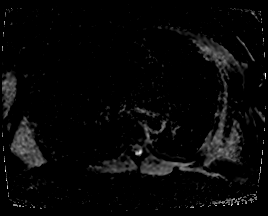

[Series 9: T2 · axial · 6.0mm · 1.19mm/px · 1 of 30 slices shown (3 of 3)]
[im 1/30]
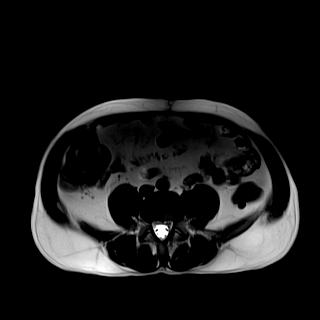

[Series 11: T1 dynamic · axial · non-contrast · 3.0mm · 1.25mm/px · z∈[-64,+149]mm · 3 of 72 slices shown]
[im 1/72]
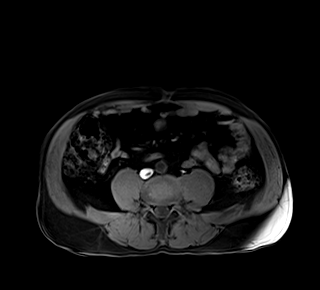
[im 36/72]
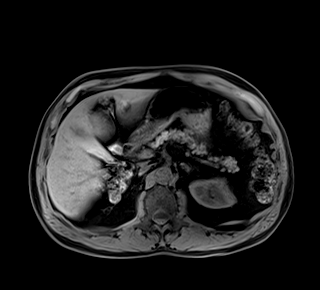
[im 72/72]
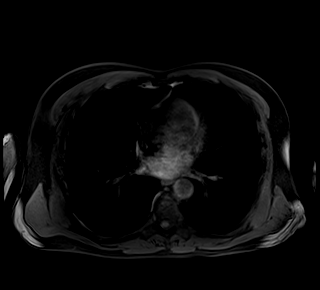

[Series 12: T1 dynamic post-contrast · axial · 3.0mm · 1.25mm/px · z∈[-64,+149]mm · 3 of 72 slices shown (1 of 9)]
[im 1/72]
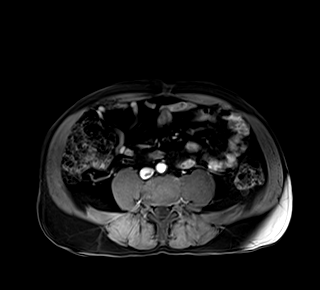
[im 36/72]
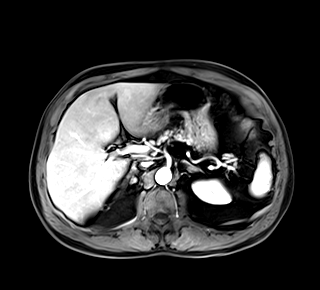
[im 72/72]
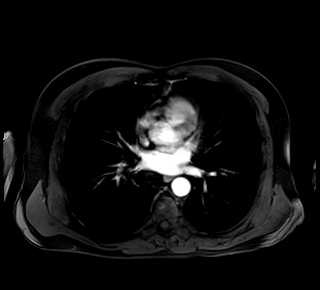

[Series 13: T1 dynamic post-contrast · axial · 3.0mm · 1.25mm/px · z∈[-64,+149]mm · 3 of 72 slices shown (2 of 9)]
[im 1/72]
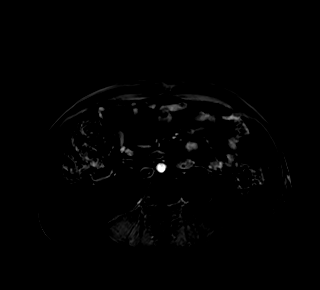
[im 36/72]
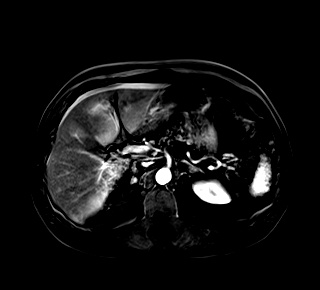
[im 72/72]
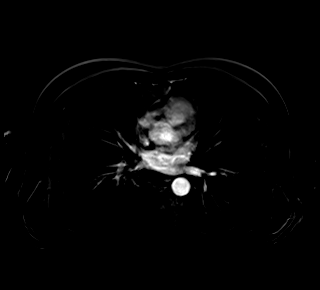

[Series 14: T1 dynamic post-contrast · axial · 3.0mm · 1.25mm/px · z∈[-64,+149]mm · 3 of 72 slices shown (3 of 9)]
[im 1/72]
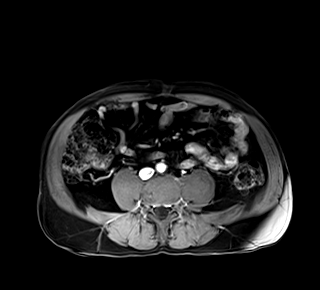
[im 36/72]
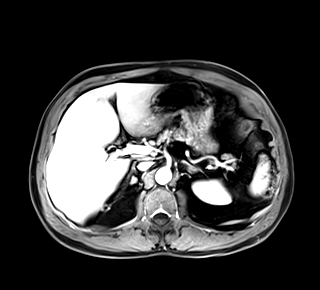
[im 72/72]
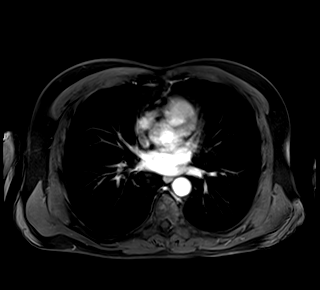

[Series 15: T1 dynamic post-contrast · axial · 3.0mm · 1.25mm/px · z∈[-64,+149]mm · 3 of 72 slices shown (4 of 9)]
[im 1/72]
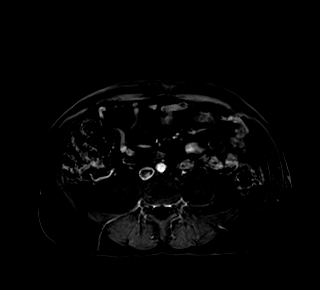
[im 36/72]
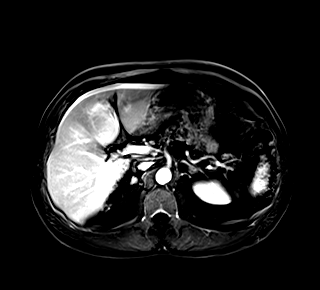
[im 72/72]
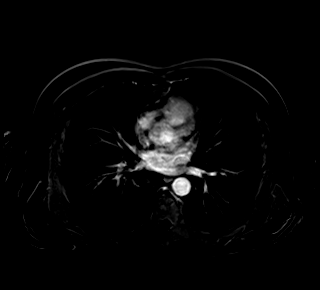

[Series 16: T1 dynamic post-contrast · axial · 3.0mm · 1.25mm/px · z∈[-64,+149]mm · 3 of 72 slices shown (5 of 9)]
[im 1/72]
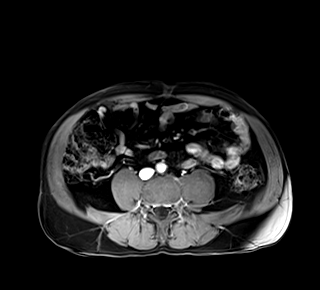
[im 36/72]
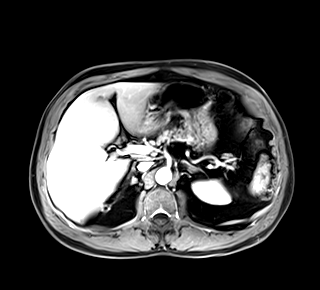
[im 72/72]
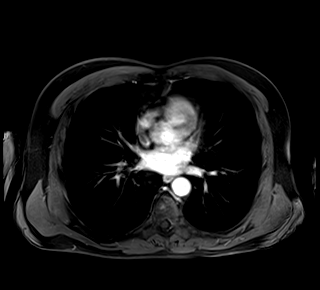

[Series 17: T1 dynamic post-contrast · axial · 3.0mm · 1.25mm/px · z∈[-64,+149]mm · 3 of 72 slices shown (6 of 9)]
[im 1/72]
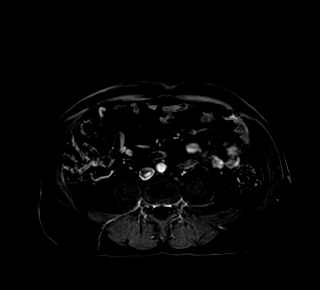
[im 36/72]
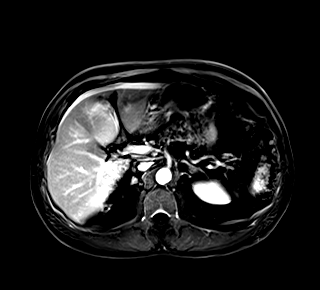
[im 72/72]
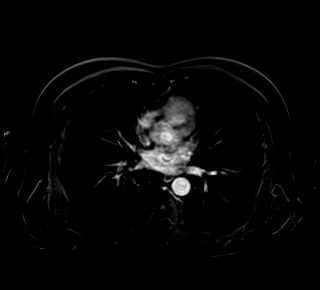

[Series 18: T1 dynamic post-contrast · coronal · 3.0mm · 1.25mm/px · 3 of 72 slices shown (7 of 9)]
[im 1/72]
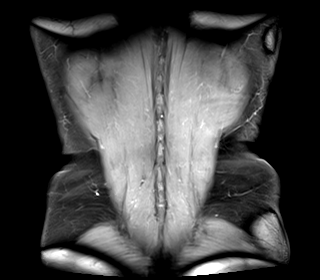
[im 36/72]
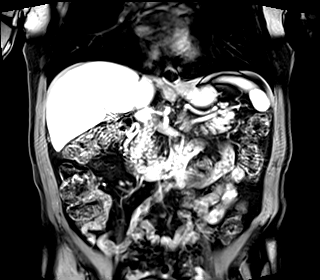
[im 72/72]
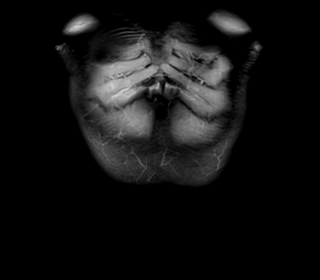

[Series 19: T1 dynamic post-contrast · axial · 3.0mm · 1.25mm/px · z∈[-64,+149]mm · 3 of 72 slices shown (8 of 9)]
[im 1/72]
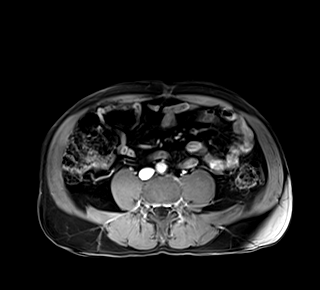
[im 36/72]
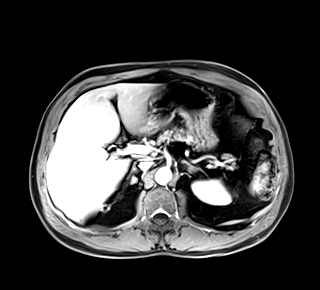
[im 72/72]
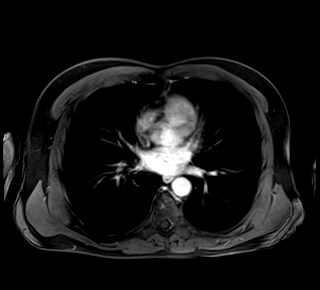

[Series 20: T1 dynamic post-contrast · axial · 3.0mm · 1.25mm/px · z∈[-64,+149]mm · 3 of 72 slices shown (9 of 9)]
[im 1/72]
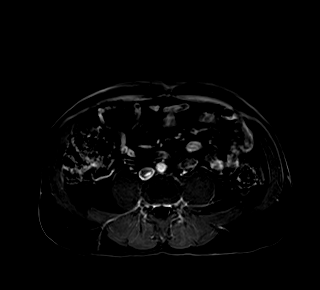
[im 36/72]
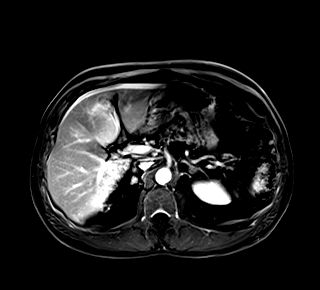
[im 72/72]
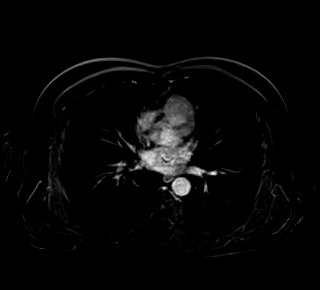

[48 of 48 positions shown; findings below may reference images not displayed]

FINDINGS: Lower chest: Unremarkable.

Hepatobiliary: No suspicious cystic or solid hepatic lesions. No
intra or extrahepatic biliary ductal dilatation. Gallbladder is
unremarkable in appearance. Common bile duct measures 4 mm in the
porta hepatis.

Pancreas: No pancreatic mass. No pancreatic ductal dilatation. No
pancreatic or peripancreatic fluid collections or inflammatory
changes.

Spleen:  Unremarkable.

Adrenals/Urinary Tract: Status post right radical nephrectomy with
small amount of postoperative scarring in the right retroperitoneal
fat, less apparent than the prior examination. Left kidney and
bilateral adrenal glands are normal in appearance. No
hydroureteronephrosis in the visualized portions of the abdomen.

Stomach/Bowel: Numerous colonic diverticulae are noted. Visualized
portions are otherwise unremarkable.

Vascular/Lymphatic: No aneurysm identified in the visualized
abdominal vasculature. No lymphadenopathy noted in the abdomen.

Other: No significant volume of ascites noted in the visualized
portions of the peritoneal cavity.

Musculoskeletal: No aggressive appearing osseous lesions are noted
in the visualized portions of the skeleton.
IMPRESSION: 1. Status post right radical nephrectomy. No findings to suggest
residual, locally recurrent or metastatic disease in the abdomen.
2. Colonic diverticulosis.

## 2021-04-08 MED ORDER — GADOBENATE DIMEGLUMINE 529 MG/ML IV SOLN
13.0000 mL | Freq: Once | INTRAVENOUS | Status: AC | PRN
  Administered 2021-04-08: 13 mL via INTRAVENOUS

## 2021-12-19 ENCOUNTER — Other Ambulatory Visit: Payer: Self-pay | Admitting: Urology

## 2021-12-19 DIAGNOSIS — D49511 Neoplasm of unspecified behavior of right kidney: Secondary | ICD-10-CM

## 2022-02-09 ENCOUNTER — Ambulatory Visit
Admission: RE | Admit: 2022-02-09 | Discharge: 2022-02-09 | Disposition: A | Discharge status: Home / Self Care | Payer: Medicare Other | Source: Ambulatory Visit | Attending: Urology | Admitting: Urology

## 2022-02-09 ENCOUNTER — Other Ambulatory Visit: Payer: Self-pay | Admitting: Urology

## 2022-02-09 DIAGNOSIS — D49511 Neoplasm of unspecified behavior of right kidney: Secondary | ICD-10-CM

## 2022-02-09 MED ORDER — GADOBENATE DIMEGLUMINE 529 MG/ML IV SOLN
13.0000 mL | Freq: Once | INTRAVENOUS | Status: AC | PRN
  Administered 2022-02-09: 13 mL via INTRAVENOUS

## 2022-02-17 ENCOUNTER — Ambulatory Visit: Payer: Medicare Other | Admitting: Podiatry

## 2022-02-17 ENCOUNTER — Ambulatory Visit (INDEPENDENT_AMBULATORY_CARE_PROVIDER_SITE_OTHER): Payer: Medicare Other | Admitting: Podiatry

## 2022-02-17 ENCOUNTER — Ambulatory Visit (INDEPENDENT_AMBULATORY_CARE_PROVIDER_SITE_OTHER): Payer: Medicare Other

## 2022-02-17 DIAGNOSIS — M722 Plantar fascial fibromatosis: Secondary | ICD-10-CM | POA: Diagnosis not present

## 2022-02-17 DIAGNOSIS — M2142 Flat foot [pes planus] (acquired), left foot: Secondary | ICD-10-CM

## 2022-02-17 DIAGNOSIS — M2141 Flat foot [pes planus] (acquired), right foot: Secondary | ICD-10-CM

## 2022-02-17 DIAGNOSIS — M778 Other enthesopathies, not elsewhere classified: Secondary | ICD-10-CM

## 2022-02-17 NOTE — Progress Notes (Unsigned)
  Subjective:  Patient ID: Mitchell Ward, male    DOB: 1955-10-27,  MRN: [id number]  Chief Complaint  Patient presents with   Foot Pain    left foot pain-while putting weight on it-  Patient has had inserts made about 20 years ago and was helping the pain. Pain is at the arch of the left foot.     66 y.o. male presents with the above complaint. ***   Review of Systems: Negative except as noted in the HPI. Denies N/V/F/Ch.   Objective:  There were no vitals filed for this visit. There is no height or weight on file to calculate BMI. Constitutional Well developed. Well nourished.  Vascular Dorsalis pedis pulses palpable bilaterally. Posterior tibial pulses palpable bilaterally. Capillary refill normal to all digits.  No cyanosis or clubbing noted. Pedal hair growth normal.  Neurologic Normal speech. Oriented to person, place, and time. Epicritic sensation to light touch grossly present bilaterally.  Dermatologic Nails well groomed and normal in appearance. No open wounds. No skin lesions.  Orthopedic: Normal joint ROM without pain or crepitus bilaterally. No visible deformities. Tender to palpation at the calcaneal tuber left. No pain with calcaneal squeeze left. Ankle ROM diminished range of motion left. Silf[place]old Test: negative left.    Assessment:   1. Capsulitis of foot, left    Plan:  Patient was evaluated and treated and all questions answered.  Plantar Fasciitis, left - XR reviewed as above.  - Educated on icing and stretching. Instructions given.  - Pt desires new pair of CMOs as his old pair is 67 yrs old and worn out.  - DME: Pt was casted for new CMOs, discussed there may be a delay in getting them due to delays with the company we use.  - Pharmacologic management: Tylenol        Everitt Amber, [place] [facility]                   02/17/2022

## 2022-03-31 ENCOUNTER — Ambulatory Visit (INDEPENDENT_AMBULATORY_CARE_PROVIDER_SITE_OTHER): Payer: Medicare Other | Admitting: Podiatry

## 2022-03-31 DIAGNOSIS — M2141 Flat foot [pes planus] (acquired), right foot: Secondary | ICD-10-CM

## 2022-03-31 DIAGNOSIS — M2142 Flat foot [pes planus] (acquired), left foot: Secondary | ICD-10-CM

## 2022-03-31 DIAGNOSIS — M722 Plantar fascial fibromatosis: Secondary | ICD-10-CM

## 2022-03-31 NOTE — Progress Notes (Signed)
Pt seen by Jazmin for dispense of [employer] orthotics, no concerns, follow up PRN

## 2022-07-24 ENCOUNTER — Other Ambulatory Visit: Payer: Self-pay | Admitting: Urology

## 2022-07-24 DIAGNOSIS — D49511 Neoplasm of unspecified behavior of right kidney: Secondary | ICD-10-CM

## 2022-08-18 ENCOUNTER — Other Ambulatory Visit: Payer: Medicare Other

## 2022-08-31 ENCOUNTER — Ambulatory Visit
Admission: RE | Admit: 2022-08-31 | Discharge: 2022-08-31 | Disposition: A | Discharge status: Home / Self Care | Payer: Medicare Other | Source: Ambulatory Visit | Attending: Urology | Admitting: Urology

## 2022-08-31 DIAGNOSIS — D49511 Neoplasm of unspecified behavior of right kidney: Secondary | ICD-10-CM

## 2022-08-31 MED ORDER — GADOPICLENOL 0.5 MMOL/ML IV SOLN
7.0000 mL | Freq: Once | INTRAVENOUS | Status: AC | PRN
  Administered 2022-08-31: 7 mL via INTRAVENOUS

## 2023-02-11 ENCOUNTER — Other Ambulatory Visit: Payer: Self-pay | Admitting: Urology

## 2023-02-11 DIAGNOSIS — C641 Malignant neoplasm of right kidney, except renal pelvis: Secondary | ICD-10-CM

## 2023-02-16 ENCOUNTER — Encounter: Payer: Self-pay | Admitting: Urology

## 2023-02-18 ENCOUNTER — Other Ambulatory Visit: Payer: Self-pay | Admitting: Urology

## 2023-02-18 DIAGNOSIS — D49511 Neoplasm of unspecified behavior of right kidney: Secondary | ICD-10-CM

## 2023-02-24 ENCOUNTER — Other Ambulatory Visit: Payer: Medicare Other

## 2023-03-09 ENCOUNTER — Ambulatory Visit
Admission: RE | Admit: 2023-03-09 | Discharge: 2023-03-09 | Disposition: A | Discharge status: Home / Self Care | Payer: Medicare Other | Source: Ambulatory Visit | Attending: Urology | Admitting: Urology

## 2023-03-09 ENCOUNTER — Ambulatory Visit
Admission: RE | Admit: 2023-03-09 | Discharge: 2023-03-09 | Disposition: A | Discharge status: Home / Self Care | Payer: Medicare Other | Source: Ambulatory Visit | Attending: Urology

## 2023-03-09 DIAGNOSIS — D49511 Neoplasm of unspecified behavior of right kidney: Secondary | ICD-10-CM

## 2023-03-09 DIAGNOSIS — C641 Malignant neoplasm of right kidney, except renal pelvis: Secondary | ICD-10-CM

## 2023-03-09 MED ORDER — GADOPICLENOL 0.5 MMOL/ML IV SOLN
7.5000 mL | Freq: Once | INTRAVENOUS | Status: AC | PRN
  Administered 2023-03-09: 6 mL via INTRAVENOUS

## 2024-03-16 ENCOUNTER — Other Ambulatory Visit: Payer: Self-pay | Admitting: Urology

## 2024-03-16 DIAGNOSIS — D49511 Neoplasm of unspecified behavior of right kidney: Secondary | ICD-10-CM

## 2024-05-05 ENCOUNTER — Ambulatory Visit
Admission: RE | Admit: 2024-05-05 | Discharge: 2024-05-05 | Disposition: A | Discharge status: Home / Self Care | Source: Ambulatory Visit | Attending: Urology | Admitting: Urology

## 2024-05-05 DIAGNOSIS — D49511 Neoplasm of unspecified behavior of right kidney: Secondary | ICD-10-CM

## 2024-05-05 MED ORDER — GADOPICLENOL 0.5 MMOL/ML IV SOLN
6.0000 mL | Freq: Once | INTRAVENOUS | Status: AC | PRN
  Administered 2024-05-05: 6 mL via INTRAVENOUS

## 2024-06-06 ENCOUNTER — Other Ambulatory Visit: Payer: Self-pay | Admitting: Urology

## 2024-06-06 DIAGNOSIS — C642 Malignant neoplasm of left kidney, except renal pelvis: Secondary | ICD-10-CM

## 2024-06-07 ENCOUNTER — Encounter: Payer: Self-pay | Admitting: Urology

## 2024-06-13 ENCOUNTER — Other Ambulatory Visit
